# Patient Record
Sex: Female | Born: 1990 | Race: White | Hispanic: No | Marital: Single | Smoking: Never smoker
Health system: Southern US, Community
[De-identification: ages and names within clinical notes are randomized; demographics above are authoritative.]

## PROBLEM LIST (undated history)

## (undated) DIAGNOSIS — R112 Nausea with vomiting, unspecified: Secondary | ICD-10-CM

## (undated) DIAGNOSIS — E0590 Thyrotoxicosis, unspecified without thyrotoxic crisis or storm: Secondary | ICD-10-CM

## (undated) DIAGNOSIS — R319 Hematuria, unspecified: Secondary | ICD-10-CM

## (undated) DIAGNOSIS — R350 Frequency of micturition: Secondary | ICD-10-CM

## (undated) DIAGNOSIS — Z9889 Other specified postprocedural states: Secondary | ICD-10-CM

## (undated) DIAGNOSIS — R3915 Urgency of urination: Secondary | ICD-10-CM

## (undated) DIAGNOSIS — R351 Nocturia: Secondary | ICD-10-CM

## (undated) DIAGNOSIS — Z8709 Personal history of other diseases of the respiratory system: Secondary | ICD-10-CM

## (undated) HISTORY — DX: Hematuria, unspecified: R31.9

## (undated) HISTORY — DX: Thyrotoxicosis, unspecified without thyrotoxic crisis or storm: E05.90

## (undated) HISTORY — PX: WISDOM TOOTH EXTRACTION: SHX21

---
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
## 2009-01-15 ENCOUNTER — Emergency Department (HOSPITAL_COMMUNITY): Admission: EM | Admit: 2009-01-15 | Discharge: 2009-01-15 | Payer: Self-pay | Admitting: Emergency Medicine

## 2009-10-25 ENCOUNTER — Ambulatory Visit (HOSPITAL_COMMUNITY): Admission: RE | Admit: 2009-10-25 | Discharge: 2009-10-25 | Payer: Self-pay | Admitting: Obstetrics and Gynecology

## 2009-11-02 ENCOUNTER — Inpatient Hospital Stay (HOSPITAL_COMMUNITY): Admission: AD | Admit: 2009-11-02 | Discharge: 2009-11-02 | Payer: Self-pay | Admitting: Obstetrics and Gynecology

## 2009-11-05 ENCOUNTER — Emergency Department (HOSPITAL_COMMUNITY): Admission: EM | Admit: 2009-11-05 | Discharge: 2009-11-05 | Payer: Self-pay | Admitting: Family Medicine

## 2010-01-17 ENCOUNTER — Inpatient Hospital Stay (HOSPITAL_COMMUNITY): Admission: AD | Admit: 2010-01-17 | Discharge: 2010-01-17 | Payer: Self-pay | Admitting: Obstetrics and Gynecology

## 2010-07-02 ENCOUNTER — Inpatient Hospital Stay (HOSPITAL_COMMUNITY)
Admission: AD | Admit: 2010-07-02 | Discharge: 2010-07-02 | Disposition: A | Discharge status: Home / Self Care | Payer: Medicaid Other | Source: Ambulatory Visit | Attending: Obstetrics and Gynecology | Admitting: Obstetrics and Gynecology

## 2010-07-02 DIAGNOSIS — O4700 False labor before 37 completed weeks of gestation, unspecified trimester: Secondary | ICD-10-CM | POA: Insufficient documentation

## 2010-07-02 LAB — HEPATIC FUNCTION PANEL
ALT: 10 U/L (ref 0–35)
AST: 22 U/L (ref 0–37)
Albumin: 2.9 g/dL — ABNORMAL LOW (ref 3.5–5.2)
Alkaline Phosphatase: 68 U/L (ref 39–117)
Bilirubin, Direct: 0.1 mg/dL (ref 0.0–0.3)
Indirect Bilirubin: 0.2 mg/dL — ABNORMAL LOW (ref 0.3–0.9)
Total Bilirubin: 0.3 mg/dL (ref 0.3–1.2)
Total Protein: 6 g/dL (ref 6.0–8.3)

## 2010-07-02 LAB — URINE MICROSCOPIC-ADD ON

## 2010-07-02 LAB — WET PREP, GENITAL
Trich, Wet Prep: NONE SEEN
Yeast Wet Prep HPF POC: NONE SEEN

## 2010-07-02 LAB — URINALYSIS, ROUTINE W REFLEX MICROSCOPIC
Bilirubin Urine: NEGATIVE
Ketones, ur: NEGATIVE mg/dL
Leukocytes, UA: NEGATIVE
Nitrite: NEGATIVE
Protein, ur: NEGATIVE mg/dL
Specific Gravity, Urine: 1.02 (ref 1.005–1.030)
Urine Glucose, Fasting: NEGATIVE mg/dL
Urobilinogen, UA: 0.2 mg/dL (ref 0.0–1.0)
pH: 7 (ref 5.0–8.0)

## 2010-07-03 LAB — STREP B DNA PROBE: Strep Group B Ag: POSITIVE

## 2010-07-03 LAB — GC/CHLAMYDIA PROBE AMP, GENITAL
Chlamydia, DNA Probe: NEGATIVE
GC Probe Amp, Genital: NEGATIVE

## 2010-08-02 LAB — URINE CULTURE
Colony Count: NO GROWTH
Culture  Setup Time: 201109010524
Culture: NO GROWTH

## 2010-08-03 LAB — WET PREP, GENITAL
Clue Cells Wet Prep HPF POC: NONE SEEN
Trich, Wet Prep: NONE SEEN

## 2010-08-03 LAB — URINALYSIS, ROUTINE W REFLEX MICROSCOPIC
Bilirubin Urine: NEGATIVE
Glucose, UA: NEGATIVE mg/dL
Ketones, ur: 15 mg/dL — AB
Leukocytes, UA: NEGATIVE
Nitrite: NEGATIVE
Protein, ur: NEGATIVE mg/dL
Specific Gravity, Urine: >1.03 — ABNORMAL HIGH (ref 1.005–1.030)
Urobilinogen, UA: 0.2 mg/dL (ref 0.0–1.0)
pH: 6 (ref 5.0–8.0)

## 2010-08-03 LAB — CBC
Hemoglobin: 11.8 g/dL — ABNORMAL LOW (ref 12.0–15.0)
MCH: 31.1 pg (ref 26.0–34.0)
MCV: 90.7 fL (ref 78.0–100.0)
Platelets: 265 10*3/uL (ref 150–400)
RBC: 3.79 MIL/uL — ABNORMAL LOW (ref 3.87–5.11)
WBC: 8 10*3/uL (ref 4.0–10.5)

## 2010-08-03 LAB — URINE MICROSCOPIC-ADD ON

## 2010-08-03 LAB — HCG, QUANTITATIVE, PREGNANCY: hCG, Beta Chain, Quant, S: 7663 m[IU]/mL — ABNORMAL HIGH (ref <5)

## 2010-08-05 LAB — CBC
HCT: 35.2 % — ABNORMAL LOW (ref 36.0–46.0)
MCHC: 34.1 g/dL (ref 30.0–36.0)
MCV: 89.3 fL (ref 78.0–100.0)
Platelets: 308 10*3/uL (ref 150–400)
RDW: 13.5 % (ref 11.5–15.5)
WBC: 8.5 10*3/uL (ref 4.0–10.5)

## 2010-08-05 LAB — DIFFERENTIAL
Basophils Absolute: 0 10*3/uL (ref 0.0–0.1)
Basophils Relative: 0 % (ref 0–1)
Eosinophils Absolute: 0.1 10*3/uL (ref 0.0–0.7)
Eosinophils Relative: 1 % (ref 0–5)
Lymphs Abs: 3.2 10*3/uL (ref 0.7–4.0)
Neutrophils Relative %: 55 % (ref 43–77)

## 2010-08-23 ENCOUNTER — Inpatient Hospital Stay (HOSPITAL_COMMUNITY)
Admission: AD | Admit: 2010-08-23 | Discharge: 2010-08-23 | Disposition: A | Discharge status: Home / Self Care | Payer: Medicaid Other | Source: Ambulatory Visit | Attending: Obstetrics and Gynecology | Admitting: Obstetrics and Gynecology

## 2010-08-23 DIAGNOSIS — B9789 Other viral agents as the cause of diseases classified elsewhere: Secondary | ICD-10-CM | POA: Insufficient documentation

## 2010-08-23 DIAGNOSIS — O99891 Other specified diseases and conditions complicating pregnancy: Secondary | ICD-10-CM | POA: Insufficient documentation

## 2010-08-23 DIAGNOSIS — O212 Late vomiting of pregnancy: Secondary | ICD-10-CM | POA: Insufficient documentation

## 2010-08-23 LAB — URINALYSIS, ROUTINE W REFLEX MICROSCOPIC
Glucose, UA: NEGATIVE mg/dL
Hgb urine dipstick: NEGATIVE
Ketones, ur: NEGATIVE mg/dL
pH: 6.5 (ref 5.0–8.0)

## 2010-08-23 LAB — COMPREHENSIVE METABOLIC PANEL
ALT: 16 U/L (ref 0–35)
AST: 27 U/L (ref 0–37)
Albumin: 2.8 g/dL — ABNORMAL LOW (ref 3.5–5.2)
CO2: 22 mEq/L (ref 19–32)
Chloride: 108 mEq/L (ref 96–112)
Creatinine, Ser: 0.69 mg/dL (ref 0.4–1.2)
GFR calc Af Amer: >60 mL/min (ref >60)
Sodium: 136 mEq/L (ref 135–145)
Total Bilirubin: 0.5 mg/dL (ref 0.3–1.2)

## 2010-08-23 LAB — DIFFERENTIAL
Basophils Absolute: 0 10*3/uL (ref 0.0–0.1)
Basophils Relative: 0 % (ref 0–1)
Eosinophils Absolute: 0.1 10*3/uL (ref 0.0–0.7)
Monocytes Relative: 6 % (ref 3–12)
Neutro Abs: 8.2 10*3/uL — ABNORMAL HIGH (ref 1.7–7.7)
Neutrophils Relative %: 72 % (ref 43–77)

## 2010-08-23 LAB — CBC
Hemoglobin: 9.7 g/dL — ABNORMAL LOW (ref 12.0–15.0)
MCH: 27.9 pg (ref 26.0–34.0)
Platelets: 277 10*3/uL (ref 150–400)
RBC: 3.48 MIL/uL — ABNORMAL LOW (ref 3.87–5.11)
WBC: 11.5 10*3/uL — ABNORMAL HIGH (ref 4.0–10.5)

## 2010-08-25 LAB — URINALYSIS, ROUTINE W REFLEX MICROSCOPIC
Bilirubin Urine: NEGATIVE
Glucose, UA: NEGATIVE mg/dL
Ketones, ur: NEGATIVE mg/dL
Leukocytes, UA: NEGATIVE
pH: 7.5 (ref 5.0–8.0)

## 2010-08-25 LAB — WET PREP, GENITAL
Trich, Wet Prep: NONE SEEN
Yeast Wet Prep HPF POC: NONE SEEN

## 2010-08-25 LAB — POCT I-STAT, CHEM 8
BUN: 11 mg/dL (ref 6–23)
Chloride: 103 mEq/L (ref 96–112)
HCT: 39 % (ref 36.0–46.0)
Potassium: 4.3 mEq/L (ref 3.5–5.1)

## 2010-08-25 LAB — PROTIME-INR
INR: 1 (ref 0.00–1.49)
Prothrombin Time: 13.5 seconds (ref 11.6–15.2)

## 2010-08-25 LAB — GC/CHLAMYDIA PROBE AMP, GENITAL: GC Probe Amp, Genital: NEGATIVE

## 2010-08-25 LAB — HCG, QUANTITATIVE, PREGNANCY: hCG, Beta Chain, Quant, S: <2 m[IU]/mL (ref <5)

## 2010-08-25 LAB — URINE MICROSCOPIC-ADD ON

## 2010-08-25 LAB — APTT: aPTT: 29 seconds (ref 24–37)

## 2010-08-27 LAB — URINALYSIS, ROUTINE W REFLEX MICROSCOPIC
Hgb urine dipstick: NEGATIVE
Nitrite: NEGATIVE
Specific Gravity, Urine: 1.015 (ref 1.005–1.030)
Urobilinogen, UA: 1 mg/dL (ref 0.0–1.0)

## 2010-08-27 LAB — POCT URINALYSIS DIP (DEVICE)
Glucose, UA: NEGATIVE mg/dL
Hgb urine dipstick: NEGATIVE
Nitrite: NEGATIVE
Urobilinogen, UA: 0.2 mg/dL (ref 0.0–1.0)

## 2010-08-27 LAB — GC/CHLAMYDIA PROBE AMP, GENITAL
GC Probe Amp, Genital: NEGATIVE
GC Probe Amp, Genital: NEGATIVE

## 2010-08-27 LAB — WET PREP, GENITAL: Yeast Wet Prep HPF POC: NONE SEEN

## 2010-08-27 LAB — POCT PREGNANCY, URINE: Preg Test, Ur: NEGATIVE

## 2010-08-28 LAB — CBC
Platelets: 272 10*3/uL (ref 150–400)
RBC: 4.41 MIL/uL (ref 3.87–5.11)
WBC: 6.5 10*3/uL (ref 4.0–10.5)

## 2010-08-28 LAB — URINALYSIS, ROUTINE W REFLEX MICROSCOPIC
Glucose, UA: NEGATIVE mg/dL
Ketones, ur: NEGATIVE mg/dL
pH: 7.5 (ref 5.0–8.0)

## 2010-08-28 LAB — COMPREHENSIVE METABOLIC PANEL
ALT: 18 U/L (ref 0–35)
AST: 20 U/L (ref 0–37)
Albumin: 4.3 g/dL (ref 3.5–5.2)
Chloride: 107 mEq/L (ref 96–112)
Creatinine, Ser: 0.66 mg/dL (ref 0.4–1.2)
GFR calc Af Amer: >60 mL/min (ref >60)
Potassium: 3.8 mEq/L (ref 3.5–5.1)
Sodium: 140 mEq/L (ref 135–145)
Total Bilirubin: 0.7 mg/dL (ref 0.3–1.2)

## 2010-08-28 LAB — DIFFERENTIAL
Basophils Absolute: 0 10*3/uL (ref 0.0–0.1)
Eosinophils Absolute: 0.1 10*3/uL (ref 0.0–0.7)
Eosinophils Relative: 1 % (ref 0–5)
Lymphocytes Relative: 50 % — ABNORMAL HIGH (ref 12–46)
Monocytes Absolute: 0.5 10*3/uL (ref 0.1–1.0)

## 2010-08-28 LAB — WET PREP, GENITAL
Trich, Wet Prep: NONE SEEN
WBC, Wet Prep HPF POC: NONE SEEN
Yeast Wet Prep HPF POC: NONE SEEN

## 2010-08-28 LAB — PREGNANCY, URINE: Preg Test, Ur: NEGATIVE

## 2010-09-03 ENCOUNTER — Inpatient Hospital Stay (HOSPITAL_COMMUNITY): Payer: Medicaid Other

## 2010-09-03 ENCOUNTER — Inpatient Hospital Stay (HOSPITAL_COMMUNITY)
Admission: AD | Admit: 2010-09-03 | Discharge: 2010-09-03 | Disposition: A | Discharge status: Home / Self Care | Payer: Medicaid Other | Source: Ambulatory Visit | Attending: Obstetrics and Gynecology | Admitting: Obstetrics and Gynecology

## 2010-09-03 ENCOUNTER — Inpatient Hospital Stay (HOSPITAL_COMMUNITY)
Admission: AD | Admit: 2010-09-03 | Discharge: 2010-09-04 | Disposition: A | Discharge status: Home / Self Care | Payer: Medicaid Other | Source: Ambulatory Visit | Attending: Obstetrics and Gynecology | Admitting: Obstetrics and Gynecology

## 2010-09-03 DIAGNOSIS — O139 Gestational [pregnancy-induced] hypertension without significant proteinuria, unspecified trimester: Secondary | ICD-10-CM | POA: Insufficient documentation

## 2010-09-03 DIAGNOSIS — O479 False labor, unspecified: Secondary | ICD-10-CM | POA: Insufficient documentation

## 2010-09-03 LAB — URINALYSIS, ROUTINE W REFLEX MICROSCOPIC
Glucose, UA: NEGATIVE mg/dL
pH: 6 (ref 5.0–8.0)

## 2010-09-03 LAB — COMPREHENSIVE METABOLIC PANEL
AST: 28 U/L (ref 0–37)
Albumin: 2.8 g/dL — ABNORMAL LOW (ref 3.5–5.2)
Calcium: 9 mg/dL (ref 8.4–10.5)
Creatinine, Ser: 1.04 mg/dL (ref 0.4–1.2)
GFR calc Af Amer: >60 mL/min (ref >60)
GFR calc non Af Amer: >60 mL/min (ref >60)

## 2010-09-03 LAB — CBC
Hemoglobin: 9.9 g/dL — ABNORMAL LOW (ref 12.0–15.0)
RBC: 3.61 MIL/uL — ABNORMAL LOW (ref 3.87–5.11)

## 2010-09-03 LAB — URINE MICROSCOPIC-ADD ON

## 2010-09-04 ENCOUNTER — Inpatient Hospital Stay (HOSPITAL_COMMUNITY)
Admission: AD | Admit: 2010-09-04 | Discharge: 2010-09-07 | DRG: 774 | Disposition: A | Discharge status: Home / Self Care | Payer: Medicaid Other | Source: Ambulatory Visit | Attending: Obstetrics and Gynecology | Admitting: Obstetrics and Gynecology

## 2010-09-04 DIAGNOSIS — O99284 Endocrine, nutritional and metabolic diseases complicating childbirth: Secondary | ICD-10-CM | POA: Diagnosis present

## 2010-09-04 DIAGNOSIS — O99892 Other specified diseases and conditions complicating childbirth: Secondary | ICD-10-CM | POA: Diagnosis present

## 2010-09-04 DIAGNOSIS — O411090 Infection of amniotic sac and membranes, unspecified, unspecified trimester, not applicable or unspecified: Secondary | ICD-10-CM | POA: Diagnosis present

## 2010-09-04 DIAGNOSIS — E0590 Thyrotoxicosis, unspecified without thyrotoxic crisis or storm: Secondary | ICD-10-CM | POA: Diagnosis present

## 2010-09-04 DIAGNOSIS — Z22330 Carrier of Group B streptococcus: Secondary | ICD-10-CM

## 2010-09-04 DIAGNOSIS — O9902 Anemia complicating childbirth: Secondary | ICD-10-CM | POA: Diagnosis present

## 2010-09-04 DIAGNOSIS — D649 Anemia, unspecified: Secondary | ICD-10-CM | POA: Diagnosis present

## 2010-09-04 DIAGNOSIS — E079 Disorder of thyroid, unspecified: Secondary | ICD-10-CM | POA: Diagnosis present

## 2010-09-04 DIAGNOSIS — IMO0002 Reserved for concepts with insufficient information to code with codable children: Principal | ICD-10-CM | POA: Diagnosis present

## 2010-09-04 LAB — TSH: TSH: 0.837 u[IU]/mL (ref 0.350–4.500)

## 2010-09-04 LAB — URINALYSIS, DIPSTICK ONLY
Glucose, UA: NEGATIVE mg/dL
Ketones, ur: NEGATIVE mg/dL
Leukocytes, UA: NEGATIVE
Specific Gravity, Urine: <1.005 — ABNORMAL LOW (ref 1.005–1.030)
pH: 5.5 (ref 5.0–8.0)

## 2010-09-04 LAB — POCT URINALYSIS DIP (DEVICE)
Glucose, UA: NEGATIVE mg/dL
Ketones, ur: NEGATIVE mg/dL
Specific Gravity, Urine: 1.015 (ref 1.005–1.030)
Urobilinogen, UA: 0.2 mg/dL (ref 0.0–1.0)

## 2010-09-04 LAB — POCT PREGNANCY, URINE: Preg Test, Ur: NEGATIVE

## 2010-09-04 LAB — CBC
HCT: 29.9 % — ABNORMAL LOW (ref 36.0–46.0)
Platelets: 246 10*3/uL (ref 150–400)
RDW: 14.1 % (ref 11.5–15.5)
WBC: 18.9 10*3/uL — ABNORMAL HIGH (ref 4.0–10.5)

## 2010-09-04 LAB — RPR: RPR Ser Ql: NONREACTIVE

## 2010-09-04 LAB — AMNISURE RUPTURE OF MEMBRANE (ROM) NOT AT ARMC: Amnisure ROM: POSITIVE

## 2010-09-04 LAB — DIFFERENTIAL
Basophils Absolute: 0 10*3/uL (ref 0.0–0.1)
Lymphocytes Relative: 7 % — ABNORMAL LOW (ref 12–46)
Lymphs Abs: 1.3 10*3/uL (ref 0.7–4.0)
Neutro Abs: 17.3 10*3/uL — ABNORMAL HIGH (ref 1.7–7.7)
Neutrophils Relative %: 89 % — ABNORMAL HIGH (ref 43–77)

## 2010-09-05 ENCOUNTER — Other Ambulatory Visit: Payer: Self-pay | Admitting: Advanced Practice Midwife

## 2010-09-05 LAB — CREATININE CLEARANCE, URINE, 24 HOUR
Collection Interval-CRCL: 24 hours
Creatinine Clearance: 82 mL/min (ref 75–115)
Creatinine, Urine: 50.3 mg/dL
Urine Total Volume-CRCL: 2450 mL

## 2010-09-06 LAB — COMPREHENSIVE METABOLIC PANEL
ALT: 16 U/L (ref 0–35)
AST: 30 U/L (ref 0–37)
Albumin: 1.9 g/dL — ABNORMAL LOW (ref 3.5–5.2)
Alkaline Phosphatase: 104 U/L (ref 39–117)
CO2: 24 mEq/L (ref 19–32)
Chloride: 108 mEq/L (ref 96–112)
Creatinine, Ser: 1.36 mg/dL — ABNORMAL HIGH (ref 0.4–1.2)
GFR calc Af Amer: 60 mL/min — ABNORMAL LOW (ref >60)
GFR calc non Af Amer: 50 mL/min — ABNORMAL LOW (ref >60)
Potassium: 3.7 mEq/L (ref 3.5–5.1)
Sodium: 139 mEq/L (ref 135–145)
Total Bilirubin: 0.4 mg/dL (ref 0.3–1.2)

## 2010-09-06 LAB — PROTEIN, URINE, 24 HOUR
Protein, 24H Urine: 343 mg/d — ABNORMAL HIGH (ref 50–100)
Protein, Urine: 14 mg/dL

## 2010-09-06 LAB — CBC
Hemoglobin: 7.2 g/dL — ABNORMAL LOW (ref 12.0–15.0)
MCH: 27.8 pg (ref 26.0–34.0)
Platelets: 222 10*3/uL (ref 150–400)
RBC: 2.59 MIL/uL — ABNORMAL LOW (ref 3.87–5.11)
WBC: 17.1 10*3/uL — ABNORMAL HIGH (ref 4.0–10.5)

## 2010-09-06 LAB — MRSA PCR SCREENING: MRSA by PCR: NEGATIVE

## 2010-09-07 LAB — CULTURE, BETA STREP (GROUP B ONLY)

## 2010-09-13 ENCOUNTER — Inpatient Hospital Stay (HOSPITAL_COMMUNITY)
Admission: AD | Admit: 2010-09-13 | Payer: Medicaid Other | Source: Ambulatory Visit | Admitting: Obstetrics and Gynecology
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
## 2010-10-25 HISTORY — PX: INTRAUTERINE DEVICE INSERTION: SHX323

## 2011-01-16 ENCOUNTER — Emergency Department (HOSPITAL_COMMUNITY)
Admission: EM | Admit: 2011-01-16 | Discharge: 2011-01-16 | Disposition: A | Discharge status: Home / Self Care | Payer: Medicaid Other | Attending: Emergency Medicine | Admitting: Emergency Medicine

## 2011-01-16 DIAGNOSIS — Z79899 Other long term (current) drug therapy: Secondary | ICD-10-CM | POA: Insufficient documentation

## 2011-01-16 DIAGNOSIS — IMO0002 Reserved for concepts with insufficient information to code with codable children: Secondary | ICD-10-CM | POA: Insufficient documentation

## 2011-01-16 DIAGNOSIS — S8010XA Contusion of unspecified lower leg, initial encounter: Secondary | ICD-10-CM | POA: Insufficient documentation

## 2011-01-16 DIAGNOSIS — F319 Bipolar disorder, unspecified: Secondary | ICD-10-CM | POA: Insufficient documentation

## 2011-01-16 DIAGNOSIS — S51809A Unspecified open wound of unspecified forearm, initial encounter: Secondary | ICD-10-CM | POA: Insufficient documentation

## 2011-01-16 DIAGNOSIS — X789XXA Intentional self-harm by unspecified sharp object, initial encounter: Secondary | ICD-10-CM | POA: Insufficient documentation

## 2011-02-22 LAB — WET PREP, GENITAL
Clue Cells Wet Prep HPF POC: NONE SEEN
Trich, Wet Prep: NONE SEEN
Trich, Wet Prep: NONE SEEN
WBC, Wet Prep HPF POC: NONE SEEN
Yeast Wet Prep HPF POC: NONE SEEN
Yeast Wet Prep HPF POC: NONE SEEN

## 2011-02-22 LAB — POCT PREGNANCY, URINE
Operator id: 285841
Preg Test, Ur: NEGATIVE
Preg Test, Ur: NEGATIVE
Preg Test, Ur: NEGATIVE

## 2011-02-22 LAB — URINE MICROSCOPIC-ADD ON

## 2011-02-22 LAB — URINALYSIS, ROUTINE W REFLEX MICROSCOPIC
Bilirubin Urine: NEGATIVE
Bilirubin Urine: NEGATIVE
Glucose, UA: NEGATIVE mg/dL
Ketones, ur: NEGATIVE mg/dL
Ketones, ur: NEGATIVE
Nitrite: NEGATIVE
Protein, ur: NEGATIVE mg/dL
Specific Gravity, Urine: 1.007
pH: 6.5 (ref 5.0–8.0)
pH: 7

## 2011-02-22 LAB — RPR: RPR Ser Ql: NONREACTIVE

## 2011-02-28 LAB — POCT RAPID STREP A: Streptococcus, Group A Screen (Direct): NEGATIVE

## 2011-07-18 ENCOUNTER — Encounter (INDEPENDENT_AMBULATORY_CARE_PROVIDER_SITE_OTHER): Payer: Medicaid Other | Admitting: Registered Nurse

## 2011-07-18 DIAGNOSIS — N760 Acute vaginitis: Secondary | ICD-10-CM

## 2011-07-18 DIAGNOSIS — B373 Candidiasis of vulva and vagina: Secondary | ICD-10-CM

## 2011-07-29 ENCOUNTER — Ambulatory Visit: Payer: Medicaid Other | Admitting: Registered Nurse

## 2011-07-31 ENCOUNTER — Ambulatory Visit (INDEPENDENT_AMBULATORY_CARE_PROVIDER_SITE_OTHER): Payer: Medicaid Other | Admitting: Registered Nurse

## 2011-07-31 DIAGNOSIS — Z0000 Encounter for general adult medical examination without abnormal findings: Secondary | ICD-10-CM

## 2012-01-18 ENCOUNTER — Encounter (HOSPITAL_COMMUNITY): Payer: Self-pay | Admitting: General Practice

## 2012-01-18 ENCOUNTER — Emergency Department (HOSPITAL_COMMUNITY)
Admission: EM | Admit: 2012-01-18 | Discharge: 2012-01-18 | Disposition: A | Discharge status: Home / Self Care | Payer: Medicaid Other | Attending: Emergency Medicine | Admitting: Emergency Medicine

## 2012-01-18 DIAGNOSIS — B9689 Other specified bacterial agents as the cause of diseases classified elsewhere: Secondary | ICD-10-CM | POA: Insufficient documentation

## 2012-01-18 DIAGNOSIS — A499 Bacterial infection, unspecified: Secondary | ICD-10-CM | POA: Insufficient documentation

## 2012-01-18 DIAGNOSIS — Z975 Presence of (intrauterine) contraceptive device: Secondary | ICD-10-CM | POA: Insufficient documentation

## 2012-01-18 DIAGNOSIS — R42 Dizziness and giddiness: Secondary | ICD-10-CM | POA: Insufficient documentation

## 2012-01-18 DIAGNOSIS — R102 Pelvic and perineal pain: Secondary | ICD-10-CM

## 2012-01-18 DIAGNOSIS — R350 Frequency of micturition: Secondary | ICD-10-CM | POA: Insufficient documentation

## 2012-01-18 DIAGNOSIS — R110 Nausea: Secondary | ICD-10-CM | POA: Insufficient documentation

## 2012-01-18 DIAGNOSIS — R109 Unspecified abdominal pain: Secondary | ICD-10-CM | POA: Insufficient documentation

## 2012-01-18 DIAGNOSIS — N760 Acute vaginitis: Secondary | ICD-10-CM | POA: Insufficient documentation

## 2012-01-18 LAB — URINALYSIS, ROUTINE W REFLEX MICROSCOPIC
Bilirubin Urine: NEGATIVE
Ketones, ur: NEGATIVE mg/dL
Specific Gravity, Urine: 1.008 (ref 1.005–1.030)
Urobilinogen, UA: 0.2 mg/dL (ref 0.0–1.0)

## 2012-01-18 LAB — WET PREP, GENITAL: Yeast Wet Prep HPF POC: NONE SEEN

## 2012-01-18 LAB — URINE MICROSCOPIC-ADD ON

## 2012-01-18 MED ORDER — METRONIDAZOLE 500 MG PO TABS: 500.0000 mg | ORAL_TABLET | Freq: Two times a day (BID) | ORAL | Status: AC

## 2012-01-18 NOTE — ED Provider Notes (Signed)
History     CSN: [id number]  Arrival date & time 01/18/12  1625   First MD Initiated Contact with Patient 01/18/12 1717      Chief Complaint  Patient presents with  . Abdominal Pain    (Consider location/radiation/quality/duration/timing/severity/associated sxs/prior treatment) HPI Comments: Patient reports she has had lower abdominal cramping, lightheadedness, and nausea x 1 week.  States the cramping is constant.  Does admit to urinary frequency, but states this is chronic.  Denies fevers, vomiting, change in bowel habits including diarrhea, constipation, bloody stool.  Denies abnormal vaginal discharge or bleeding, or dysuria.  LMP was August 10.  Pt has irregular periods but does have them monthly, states this was lighter than usual.  Pt has taken several pregnancy tests at home, has had multiple negative tests and one that looked like a "faint positive."  States these symptoms all feel like her previous pregnancy symptoms.  States she has been treated for chlamydia over the past three weeks with two doses of azithromycin.    Patient is a 21 y.o. female presenting with abdominal pain. The history is provided by the patient.  Abdominal Pain The primary symptoms of the illness include abdominal pain. The primary symptoms of the illness do not include fever, shortness of breath, nausea, vomiting, diarrhea, dysuria, vaginal discharge or vaginal bleeding.  Additional symptoms associated with the illness include frequency. Symptoms associated with the illness do not include constipation or urgency.    History reviewed. No pertinent past medical history.  History reviewed. No pertinent past surgical history.  History reviewed. No pertinent family history.  History  Substance Use Topics  . Smoking status: Not on file  . Smokeless tobacco: Not on file  . Alcohol Use: Yes    OB History    Grav Para Term Preterm Abortions TAB SAB Ect Mult Living                  Review of Systems    Constitutional: Negative for fever.  Respiratory: Negative for cough and shortness of breath.   Cardiovascular: Negative for chest pain.  Gastrointestinal: Positive for abdominal pain. Negative for nausea, vomiting, diarrhea, constipation and blood in stool.  Genitourinary: Positive for frequency. Negative for dysuria, urgency, vaginal bleeding and vaginal discharge.    Allergies  Penicillins  Home Medications  No current outpatient prescriptions on file.  BP 114/65  Pulse 108  Temp 98.2 F (36.8 C) (Oral)  Resp 16  SpO2 99%  Physical Exam  Nursing note and vitals reviewed. Constitutional: She appears well-developed and well-nourished. No distress.  HENT:  Head: Normocephalic and atraumatic.  Neck: Neck supple.  Cardiovascular: Normal rate and regular rhythm.   Pulmonary/Chest: Effort normal and breath sounds normal. No respiratory distress. She has no wheezes. She has no rales.  Abdominal: Soft. She exhibits no distension and no mass. There is tenderness in the suprapubic area. There is no rebound and no guarding.  Genitourinary: Vagina normal. Uterus is tender. Uterus is not enlarged and not fixed. Cervix exhibits no motion tenderness, no discharge and no friability. Right adnexum displays no mass, no tenderness and no fullness. Left adnexum displays no mass, no tenderness and no fullness. No erythema, tenderness or bleeding around the vagina. No signs of injury around the vagina. No vaginal discharge found.       IUD string coming through cervical os  Neurological: She is alert.  Skin: She is not diaphoretic.    ED Course  Procedures (including critical care time)  Labs Reviewed  URINALYSIS, ROUTINE W REFLEX MICROSCOPIC - Abnormal; Notable for the following:    Hgb urine dipstick TRACE (*)     All other components within normal limits  WET PREP, GENITAL - Abnormal; Notable for the following:    Clue Cells Wet Prep HPF POC FEW (*)     WBC, Wet Prep HPF POC FEW (*)      All other components within normal limits  PREGNANCY, URINE  URINE MICROSCOPIC-ADD ON  GC/CHLAMYDIA PROBE AMP, GENITAL  URINE CULTURE   No results found.   1. Suprapubic pain   2. BV (bacterial vaginosis)       MDM  Afebrile, nontoxic pt with one week of crampy lower abdominal pain and symptoms she identifies with pregnancy.  Pregnancy test here is negative - pt does have IUD in place and has had recent period.  Doubt pregnancy.  Pt currently being treated for chlamydia.  No concern for PID on exam.  Pt with some urinary frequency that she states has been more chronic.  UA unremarkable, sent for culture.  No other associated symptoms concerning for any GI pathology.  Discussed all results with patient.  Pt given return precautions.  Pt verbalizes understanding and agrees with plan.           [place], [place] 01/19/12 [phone]

## 2012-01-18 NOTE — ED Notes (Signed)
Pt with generalized abdominal pain, dizziness, nausea. States she took a pregnancy test x 2 that were negative and 1 that looked faintly positive. Not sure of last period(Sometime last month).

## 2012-01-19 NOTE — ED Provider Notes (Signed)
Medical screening examination/treatment/procedure(s) were performed by non-physician practitioner and as supervising physician I was immediately available for consultation/collaboration.   Wendi Maya, MD 01/19/12 1535

## 2012-01-20 LAB — URINE CULTURE: Colony Count: 2000

## 2012-01-21 LAB — GC/CHLAMYDIA PROBE AMP, GENITAL: Chlamydia, DNA Probe: UNDETERMINED

## 2012-02-14 ENCOUNTER — Ambulatory Visit (INDEPENDENT_AMBULATORY_CARE_PROVIDER_SITE_OTHER): Payer: Medicaid Other | Admitting: Obstetrics and Gynecology

## 2012-02-14 ENCOUNTER — Other Ambulatory Visit: Payer: Self-pay

## 2012-02-14 ENCOUNTER — Encounter: Payer: Self-pay | Admitting: Obstetrics and Gynecology

## 2012-02-14 ENCOUNTER — Ambulatory Visit (INDEPENDENT_AMBULATORY_CARE_PROVIDER_SITE_OTHER): Payer: Medicaid Other

## 2012-02-14 VITALS — BP 100/60 | Temp 98.1°F | Ht 59.0 in | Wt 113.0 lb

## 2012-02-14 DIAGNOSIS — A64 Unspecified sexually transmitted disease: Secondary | ICD-10-CM

## 2012-02-14 DIAGNOSIS — R102 Pelvic and perineal pain: Secondary | ICD-10-CM

## 2012-02-14 DIAGNOSIS — B373 Candidiasis of vulva and vagina: Secondary | ICD-10-CM

## 2012-02-14 DIAGNOSIS — N949 Unspecified condition associated with female genital organs and menstrual cycle: Secondary | ICD-10-CM

## 2012-02-14 DIAGNOSIS — Z113 Encounter for screening for infections with a predominantly sexual mode of transmission: Secondary | ICD-10-CM

## 2012-02-14 LAB — POCT URINALYSIS DIPSTICK
Nitrite, UA: NEGATIVE
Protein, UA: NEGATIVE
pH, UA: 7

## 2012-02-14 LAB — POCT URINE PREGNANCY: Preg Test, Ur: NEGATIVE

## 2012-02-14 MED ORDER — HYDROCODONE-ACETAMINOPHEN 5-500 MG PO TABS: 1.0000 | ORAL_TABLET | Freq: Four times a day (QID) | ORAL | Status: DC | PRN

## 2012-02-14 MED ORDER — FLUCONAZOLE 150 MG PO TABS: 150.0000 mg | ORAL_TABLET | Freq: Once | ORAL | Status: AC

## 2012-02-14 MED ORDER — CIPROFLOXACIN HCL 500 MG PO TABS: 500.0000 mg | ORAL_TABLET | Freq: Two times a day (BID) | ORAL | Status: AC

## 2012-02-14 NOTE — Progress Notes (Signed)
Date pain started: 1 month ago Pain scale: 7  Imaging: no Nausea,Vomiting,Fever,chills: nausea,vomitng,fever UTI SX: frequency times 2 weeks  H/O kidney stones: no What makes pain better/worse: nothing Sexually active: yes Pt c/o pain with intercourse

## 2012-02-14 NOTE — Progress Notes (Signed)
21 YO complains of sharp/crampy intermittent pelvic pain x 1 month. Rates pain at 7/10 with pain decreasing to 4/10 with Tylenol.   Worse with intercourse and experiences pain with urination. Denies any change in bowel movements. Past 2 months only spotting x 2 days for period (usually 5 days with tampon change 10 times a day) accompanied by cramping 3/10. Patient has the Mirena IUD and was diagnosed (per patient) with chlamydia a month ago, was treated with a negative TOC at the end of August at [facility].  O: BP 100/60  Temp 98.1 F (36.7 C)  Ht 4\' 11"  (1.499 m)  Wt 113 lb (51.256 kg)  BMI 22.82 kg/m2  LMP 02/03/2012       Abdomen: soft, tender with voluntary guarding in both lower quadrants, no rebound      Pelvic: EGBUS-wnl, vagina-normal, cervix-strings visible with + motion tenderness, uterus-exquisitely tender, adnexae-tender without palpable masses  U/S: uterus-8.66 x 5.73 x 3.99 cm, endometrium 4.77 mm; normal appearing ovaries and IUD within endometrium per 3D rendering  A: Pelvic Pain     Yeast Vaginitis     Recent treatment for Chlamydia (early August 2013)     Mirena IIUD  P:  Consulted Dr. Pennie Rushing as patient is allergic to Doxycycline and PCN       Advised Cipro 500 mg bid x 7 days with follow up in 1 week        Vicodin 5/500 #20 1 po q 6 hours prn breakthrough pain        NSAIDs with food as directed x 5 days        Diflucan 150 mg #1 1 po stat 1 refill        GC/CT-pending (patient's last Chlamydia test 01/18/12 was indeterminant-had bee treated, per patient, at least 3 weeks prior)        RTO-1 week  Zeynep Fantroy, PA-C

## 2012-02-14 NOTE — Patient Instructions (Addendum)
Monilial Vaginitis Vaginitis in a soreness, swelling and redness (inflammation) of the vagina and vulva. Monilial vaginitis is not a sexually transmitted infection. CAUSES  Yeast vaginitis is caused by yeast (candida) that is normally found in your vagina. With a yeast infection, the candida has overgrown in number to a point that upsets the chemical balance. SYMPTOMS   White, thick vaginal discharge.   Swelling, itching, redness and irritation of the vagina and possibly the lips of the vagina (vulva).   Burning or painful urination.   Painful intercourse.  DIAGNOSIS  Things that may contribute to monilial vaginitis are:  Postmenopausal and virginal states.   Pregnancy.   Infections.   Being tired, sick or stressed, especially if you had monilial vaginitis in the past.   Diabetes. Good control will help lower the chance.   Birth control pills.   Tight fitting garments.   Using bubble bath, feminine sprays, douches or deodorant tampons.   Taking certain medications that kill germs (antibiotics).   Sporadic recurrence can occur if you become ill.  TREATMENT  Your caregiver will give you medication.  There are several kinds of anti monilial vaginal creams and suppositories specific for monilial vaginitis. For recurrent yeast infections, use a suppository or cream in the vagina 2 times a week, or as directed.   Anti-monilial or steroid cream for the itching or irritation of the vulva may also be used. Get your caregiver's permission.   Painting the vagina with methylene blue solution may help if the monilial cream does not work.   Eating yogurt may help prevent monilial vaginitis.  HOME CARE INSTRUCTIONS   Finish all medication as prescribed.   Do not have sex until treatment is completed or after your caregiver tells you it is okay.   Take warm sitz baths.   Do not douche.   Do not use tampons, especially scented ones.   Wear cotton underwear.   Avoid tight  pants and panty hose.   Tell your sexual partner that you have a yeast infection. They should go to their caregiver if they have symptoms such as mild rash or itching.   Your sexual partner should be treated as well if your infection is difficult to eliminate.   Practice safer sex. Use condoms.   Some vaginal medications cause latex condoms to fail. Vaginal medications that harm condoms are:   Cleocin cream.   Butoconazole (Femstat).   Terconazole (Terazol) vaginal suppository.   Miconazole (Monistat) (may be purchased over the counter).  SEEK MEDICAL CARE IF:   You have a temperature by mouth above 102 F (38.9 C).   The infection is getting worse after 2 days of treatment.   The infection is not getting better after 3 days of treatment.   You develop blisters in or around your vagina.   You develop vaginal bleeding, and it is not your menstrual period.   You have pain when you urinate.   You develop intestinal problems.   You have pain with sexual intercourse.  Document Released: 02/13/2005 Document Revised: 04/25/2011 Document Reviewed: 10/28/2008 Exit[employer] Patient Information 2012 [employer]. 

## 2012-03-04 ENCOUNTER — Encounter (HOSPITAL_COMMUNITY): Payer: Self-pay | Admitting: *Deleted

## 2012-03-04 ENCOUNTER — Emergency Department (HOSPITAL_COMMUNITY)
Admission: EM | Admit: 2012-03-04 | Discharge: 2012-03-04 | Disposition: A | Discharge status: Home / Self Care | Payer: Medicaid Other | Attending: Emergency Medicine | Admitting: Emergency Medicine

## 2012-03-04 DIAGNOSIS — R51 Headache: Secondary | ICD-10-CM | POA: Insufficient documentation

## 2012-03-04 MED ORDER — DEXAMETHASONE SODIUM PHOSPHATE 10 MG/ML IJ SOLN
10.0000 mg | Freq: Once | INTRAMUSCULAR | Status: AC
  Administered 2012-03-04: 10 mg via INTRAVENOUS
  Filled 2012-03-04: qty 1

## 2012-03-04 MED ORDER — SODIUM CHLORIDE 0.9 % IV BOLUS (SEPSIS)
1000.0000 mL | Freq: Once | INTRAVENOUS | Status: AC
  Administered 2012-03-04: 1000 mL via INTRAVENOUS

## 2012-03-04 MED ORDER — METOCLOPRAMIDE HCL 5 MG/ML IJ SOLN
10.0000 mg | Freq: Once | INTRAMUSCULAR | Status: AC
  Administered 2012-03-04: 10 mg via INTRAVENOUS
  Filled 2012-03-04: qty 2

## 2012-03-04 MED ORDER — DIPHENHYDRAMINE HCL 50 MG/ML IJ SOLN
12.5000 mg | Freq: Once | INTRAMUSCULAR | Status: AC
  Administered 2012-03-04: 12.5 mg via INTRAVENOUS
  Filled 2012-03-04: qty 1

## 2012-03-04 NOTE — ED Notes (Signed)
Pt reports she is ready to go and doesn't want to stay any longer. Pt denies migraine at time.

## 2012-03-04 NOTE — ED Notes (Signed)
Pt with hx of chronic migraines c/o worsening migraine X [redacted] week along with dizziness. Pt reports she fainted 3 X in the past week, she got dizzy then just collapsed, pt reports LOC, denies hitting head. Pt reports ringing in bilateral ears X 2 days.

## 2012-03-04 NOTE — ED Notes (Signed)
headasche for one week  With a history of the same.  No nv or diarrhea.  She had botox injections 3 months ago that did not help

## 2012-03-04 NOTE — ED Provider Notes (Signed)
History     CSN: [id number]  Arrival date & time 03/04/12  1755   First MD Initiated Contact with Patient 03/04/12 2043      Chief Complaint  Patient presents with  . Headache    HPI  21 year old female with history of migraines presents with a weeklong history of headache. Headache feels similar to prior headaches. No nausea, vomiting, chest pain, visual disturbance. She is been using Tylenol around-the-clock. With mild relief in symptoms. Headache not described as worse in her life. Gradual in onset. She denies any trauma. Pain is currently a 6/10. Described as a deep dull ache primarily to the left parietal and periorbital area. She denies any neck stiffness, fevers.  Past Medical History  Diagnosis Date  . Vaginal discharge   . BV (bacterial vaginosis)   . Yeast infection   . Hematuria   . Abnormal Pap smear 07/31/11    LSIL  . Hyperthyroidism     History reviewed. No pertinent past surgical history.  Family History  Problem Relation Age of Onset  . Cancer Mother     cervical  . Migraines Mother   . Hypothyroidism Sister     History  Substance Use Topics  . Smoking status: Never Smoker   . Smokeless tobacco: Not on file  . Alcohol Use: Yes    OB History    Grav Para Term Preterm Abortions TAB SAB Ect Mult Living   2 1        1       Review of Systems  Constitutional: Negative for fever, chills, activity change and appetite change.  HENT: Negative for ear pain, congestion, rhinorrhea and neck pain.   Eyes: Negative for pain.  Respiratory: Negative for cough and shortness of breath.   Cardiovascular: Negative for chest pain and palpitations.  Gastrointestinal: Negative for nausea, vomiting and abdominal pain.  Genitourinary: Negative for dysuria, difficulty urinating and pelvic pain.  Musculoskeletal: Negative for back pain.  Skin: Negative for rash and wound.  Neurological: Positive for headaches. Negative for weakness.  Psychiatric/Behavioral: Negative  for behavioral problems, confusion and agitation.    Allergies  Doxycycline; Penicillins; and Shrimp  Home Medications   Current Outpatient Rx  Name Route Sig Dispense Refill  . ACETAMINOPHEN 500 MG PO TABS Oral Take 1,000 mg by mouth every 6 (six) hours as needed. For pain      BP 120/69  Pulse 94  Temp 99.2 F (37.3 C) (Oral)  Resp 20  SpO2 99%  LMP 02/03/2012  Physical Exam  Constitutional: She is oriented to person, place, and time. She appears well-developed and well-nourished. No distress.  HENT:  Head: Normocephalic and atraumatic.  Nose: Nose normal.  Mouth/Throat: Oropharynx is clear and moist.  Eyes: EOM are normal. Pupils are equal, round, and reactive to light.       No papilledema or hemorrhage on funduscopic exam  Neck: Normal range of motion. Neck supple. No tracheal deviation present.  Cardiovascular: Normal rate, regular rhythm, normal heart sounds and intact distal pulses.   Pulmonary/Chest: Effort normal and breath sounds normal. She has no rales.  Abdominal: Soft. Bowel sounds are normal. She exhibits no distension. There is no tenderness. There is no rebound and no guarding.  Musculoskeletal: Normal range of motion. She exhibits no tenderness.  Neurological: She is alert and oriented to person, place, and time. She has normal strength. No cranial nerve deficit or sensory deficit. She exhibits normal muscle tone. She displays a negative Romberg sign. Coordination  normal. GCS eye subscore is 4. GCS verbal subscore is 5. GCS motor subscore is 6.  Reflex Scores:      Tricep reflexes are 2+ on the right side and 2+ on the left side.      Patellar reflexes are 2+ on the right side and 2+ on the left side.      Achilles reflexes are 2+ on the right side and 2+ on the left side. Skin: Skin is warm and dry. No rash noted.  Psychiatric: She has a normal mood and affect. Her behavior is normal.    ED Course  Procedures   Labs Reviewed - No data to display No  results found.   1. Headache       MDM    21 year old female in no acute distress, afebrile, vital signs stable, non toxic appearing who presents with headache. Multiple prior similar episodes since the age of 47. No neck stiffness, pain is range of motion of the neck doubt meningitis. Headache improved shortly after administration of migraine cocktail (Reglan, Decadron, Benadryl). Patient insisted she be discharged as quickly as possible. As her one 20 year old son was becoming restless in the room. Thorough discussion with the patient on return precautions and plan. She will follow up with her primary care doctor. Consider outpatient neurology consult for long-standing recurrent headaches.         Nadara Mustard, MD 03/04/12 2157

## 2012-03-05 NOTE — ED Provider Notes (Signed)
I saw and evaluated the patient, reviewed the resident's note and I agree with the findings and plan.   .Face to face Exam:  General:  Awake HEENT:  Atraumatic Resp:  Normal effort Abd:  Nondistended Neuro:No focal weakness Lymph: No adenopathy   Nelia Shi, MD 03/05/12 1200

## 2012-12-01 ENCOUNTER — Other Ambulatory Visit: Payer: Self-pay | Admitting: Urology

## 2012-12-23 ENCOUNTER — Encounter (HOSPITAL_BASED_OUTPATIENT_CLINIC_OR_DEPARTMENT_OTHER): Payer: Self-pay | Admitting: *Deleted

## 2012-12-24 ENCOUNTER — Encounter (HOSPITAL_BASED_OUTPATIENT_CLINIC_OR_DEPARTMENT_OTHER): Payer: Self-pay | Admitting: *Deleted

## 2012-12-24 NOTE — Progress Notes (Signed)
NPO AFTER MN. ARRIVES AT 0715. NEEDS HG AND URINE PREG.

## 2012-12-31 ENCOUNTER — Other Ambulatory Visit (HOSPITAL_COMMUNITY)
Admission: RE | Admit: 2012-12-31 | Discharge: 2012-12-31 | Disposition: A | Discharge status: Home / Self Care | Payer: Medicaid Other | Source: Ambulatory Visit | Attending: Family Medicine | Admitting: Family Medicine

## 2012-12-31 ENCOUNTER — Other Ambulatory Visit: Payer: Self-pay | Admitting: Physician Assistant

## 2012-12-31 DIAGNOSIS — Z01419 Encounter for gynecological examination (general) (routine) without abnormal findings: Secondary | ICD-10-CM | POA: Insufficient documentation

## 2013-01-01 ENCOUNTER — Ambulatory Visit (HOSPITAL_BASED_OUTPATIENT_CLINIC_OR_DEPARTMENT_OTHER): Payer: Medicaid Other | Admitting: Anesthesiology

## 2013-01-01 ENCOUNTER — Encounter (HOSPITAL_BASED_OUTPATIENT_CLINIC_OR_DEPARTMENT_OTHER): Payer: Self-pay | Admitting: *Deleted

## 2013-01-01 ENCOUNTER — Ambulatory Visit (HOSPITAL_BASED_OUTPATIENT_CLINIC_OR_DEPARTMENT_OTHER)
Admission: RE | Admit: 2013-01-01 | Discharge: 2013-01-01 | Disposition: A | Discharge status: Home / Self Care | Payer: Medicaid Other | Source: Ambulatory Visit | Attending: Urology | Admitting: Urology

## 2013-01-01 ENCOUNTER — Encounter (HOSPITAL_BASED_OUTPATIENT_CLINIC_OR_DEPARTMENT_OTHER): Payer: Self-pay | Admitting: Anesthesiology

## 2013-01-01 ENCOUNTER — Encounter (HOSPITAL_BASED_OUTPATIENT_CLINIC_OR_DEPARTMENT_OTHER)
Admission: RE | Disposition: A | Discharge status: Home / Self Care | Payer: Self-pay | Source: Ambulatory Visit | Attending: Urology

## 2013-01-01 DIAGNOSIS — Z79899 Other long term (current) drug therapy: Secondary | ICD-10-CM | POA: Insufficient documentation

## 2013-01-01 DIAGNOSIS — G4730 Sleep apnea, unspecified: Secondary | ICD-10-CM | POA: Insufficient documentation

## 2013-01-01 DIAGNOSIS — N3010 Interstitial cystitis (chronic) without hematuria: Secondary | ICD-10-CM | POA: Insufficient documentation

## 2013-01-01 DIAGNOSIS — E039 Hypothyroidism, unspecified: Secondary | ICD-10-CM | POA: Insufficient documentation

## 2013-01-01 HISTORY — DX: Frequency of micturition: R35.0

## 2013-01-01 HISTORY — DX: Personal history of other diseases of the respiratory system: Z87.09

## 2013-01-01 HISTORY — DX: Other specified postprocedural states: R11.2

## 2013-01-01 HISTORY — DX: Urgency of urination: R39.15

## 2013-01-01 HISTORY — DX: Other specified postprocedural states: Z98.890

## 2013-01-01 HISTORY — DX: Nocturia: R35.1

## 2013-01-01 HISTORY — PX: CYSTOSCOPY: SHX5120

## 2013-01-01 LAB — POCT PREGNANCY, URINE: Preg Test, Ur: NEGATIVE

## 2013-01-01 LAB — POCT HEMOGLOBIN-HEMACUE: Hemoglobin: 12.5 g/dL (ref 12.0–15.0)

## 2013-01-01 SURGERY — CYSTOSCOPY
Anesthesia: General | Site: Bladder | Wound class: Clean Contaminated

## 2013-01-01 MED ORDER — FENTANYL CITRATE 0.05 MG/ML IJ SOLN
25.0000 ug | INTRAMUSCULAR | Status: DC | PRN
  Filled 2013-01-01: qty 1

## 2013-01-01 MED ORDER — CIPROFLOXACIN IN D5W 400 MG/200ML IV SOLN
INTRAVENOUS | Status: DC | PRN
  Administered 2013-01-01: 400 mg via INTRAVENOUS

## 2013-01-01 MED ORDER — LACTATED RINGERS IV SOLN
INTRAVENOUS | Status: DC
  Administered 2013-01-01 (×2): via INTRAVENOUS
  Filled 2013-01-01: qty 1000

## 2013-01-01 MED ORDER — ONDANSETRON HCL 4 MG/2ML IJ SOLN
INTRAMUSCULAR | Status: DC | PRN
  Administered 2013-01-01: 4 mg via INTRAVENOUS

## 2013-01-01 MED ORDER — LIDOCAINE HCL (CARDIAC) 20 MG/ML IV SOLN
INTRAVENOUS | Status: DC | PRN
  Administered 2013-01-01 (×2): 50 mg via INTRAVENOUS

## 2013-01-01 MED ORDER — HYDROCODONE-ACETAMINOPHEN 5-325 MG PO TABS
1.0000 | ORAL_TABLET | Freq: Once | ORAL | Status: AC
  Administered 2013-01-01: 1 via ORAL
  Filled 2013-01-01: qty 1

## 2013-01-01 MED ORDER — SCOPOLAMINE 1 MG/3DAYS TD PT72
MEDICATED_PATCH | TRANSDERMAL | Status: DC | PRN
  Administered 2013-01-01: 1.5 mg via TRANSDERMAL

## 2013-01-01 MED ORDER — DEXAMETHASONE SODIUM PHOSPHATE 4 MG/ML IJ SOLN
INTRAMUSCULAR | Status: DC | PRN
  Administered 2013-01-01: 8 mg via INTRAVENOUS

## 2013-01-01 MED ORDER — STERILE WATER FOR IRRIGATION IR SOLN
Status: DC | PRN
  Administered 2013-01-01: 3000 mL via INTRAVESICAL

## 2013-01-01 MED ORDER — MIDAZOLAM HCL 5 MG/5ML IJ SOLN
INTRAMUSCULAR | Status: DC | PRN
  Administered 2013-01-01 (×2): 1 mg via INTRAVENOUS

## 2013-01-01 MED ORDER — PROPOFOL 10 MG/ML IV BOLUS
INTRAVENOUS | Status: DC | PRN
  Administered 2013-01-01: 200 mg via INTRAVENOUS

## 2013-01-01 MED ORDER — KETOROLAC TROMETHAMINE 30 MG/ML IJ SOLN
15.0000 mg | Freq: Once | INTRAMUSCULAR | Status: DC | PRN
  Filled 2013-01-01: qty 1

## 2013-01-01 MED ORDER — FENTANYL CITRATE 0.05 MG/ML IJ SOLN
INTRAMUSCULAR | Status: DC | PRN
  Administered 2013-01-01 (×3): 25 ug via INTRAVENOUS
  Administered 2013-01-01: 50 ug via INTRAVENOUS
  Administered 2013-01-01 (×3): 25 ug via INTRAVENOUS

## 2013-01-01 MED ORDER — PHENAZOPYRIDINE HCL 200 MG PO TABS
ORAL | Status: DC | PRN
  Administered 2013-01-01: 10:00:00 via INTRAVESICAL

## 2013-01-01 MED ORDER — CEFAZOLIN SODIUM 1-5 GM-% IV SOLN
1.0000 g | INTRAVENOUS | Status: DC
  Filled 2013-01-01: qty 50

## 2013-01-01 MED ORDER — PROMETHAZINE HCL 25 MG/ML IJ SOLN
6.2500 mg | INTRAMUSCULAR | Status: DC | PRN
  Administered 2013-01-01: 6.25 mg via INTRAVENOUS
  Filled 2013-01-01: qty 1

## 2013-01-01 MED ORDER — KETOROLAC TROMETHAMINE 30 MG/ML IJ SOLN
INTRAMUSCULAR | Status: DC | PRN
  Administered 2013-01-01: 30 mg via INTRAVENOUS

## 2013-01-01 SURGICAL SUPPLY — 18 items
BAG DRAIN URO-CYSTO SKYTR STRL (DRAIN) ×2 IMPLANT
BAG DRN UROCATH (DRAIN) ×1
CANISTER SUCT LVC 12 LTR MEDI- (MISCELLANEOUS) IMPLANT
CLOTH BEACON ORANGE TIMEOUT ST (SAFETY) ×2 IMPLANT
DRAPE CAMERA CLOSED 9X96 (DRAPES) IMPLANT
ELECT REM PT RETURN 9FT ADLT (ELECTROSURGICAL) ×2
ELECTRODE REM PT RTRN 9FT ADLT (ELECTROSURGICAL) ×1 IMPLANT
GLOVE BIO SURGEON STRL SZ 6.5 (GLOVE) ×2 IMPLANT
GLOVE BIO SURGEON STRL SZ7 (GLOVE) ×2 IMPLANT
GLOVE INDICATOR 6.5 STRL GRN (GLOVE) ×2 IMPLANT
GOWN STRL NON-REIN LRG LVL3 (GOWN DISPOSABLE) ×4 IMPLANT
NDL SAFETY ECLIPSE 18X1.5 (NEEDLE) ×1 IMPLANT
NEEDLE HYPO 18GX1.5 SHARP (NEEDLE) ×2
NEEDLE HYPO 22GX1.5 SAFETY (NEEDLE) IMPLANT
NS IRRIG 500ML POUR BTL (IV SOLUTION) IMPLANT
PACK CYSTOSCOPY (CUSTOM PROCEDURE TRAY) ×2 IMPLANT
SYR 20CC LL (SYRINGE) ×2 IMPLANT
WATER STERILE IRR 3000ML UROMA (IV SOLUTION) ×2 IMPLANT

## 2013-01-01 NOTE — Anesthesia Preprocedure Evaluation (Signed)
Anesthesia Evaluation  Patient identified by MRN, date of birth, ID band Patient awake    Reviewed: Allergy & Precautions, H&P , NPO status , Patient's Chart, lab work & pertinent test results  History of Anesthesia Complications (+) PONV  Airway Mallampati: II TM Distance: >3 FB Neck ROM: Full    Dental no notable dental hx.    Pulmonary neg pulmonary ROS,  breath sounds clear to auscultation  Pulmonary exam normal       Cardiovascular negative cardio ROS  Rhythm:Regular Rate:Normal     Neuro/Psych negative neurological ROS  negative psych ROS   GI/Hepatic negative GI ROS, Neg liver ROS,   Endo/Other  Hyperthyroidism Hyperthyroidism   DX 2010--  NO TX PER PT --  JUST MONITORED   Renal/GU negative Renal ROS  negative genitourinary   Musculoskeletal negative musculoskeletal ROS (+)   Abdominal   Peds negative pediatric ROS (+)  Hematology negative hematology ROS (+)   Anesthesia Other Findings   Reproductive/Obstetrics negative OB ROS                           Anesthesia Physical Anesthesia Plan  ASA: I  Anesthesia Plan: General   Post-op Pain Management:    Induction: Intravenous  Airway Management Planned: LMA  Additional Equipment:   Intra-op Plan:   Post-operative Plan:   Informed Consent: I have reviewed the patients History and Physical, chart, labs and discussed the procedure including the risks, benefits and alternatives for the proposed anesthesia with the patient or authorized representative who has indicated his/her understanding and acceptance.   Dental advisory given  Plan Discussed with: CRNA and Surgeon  Anesthesia Plan Comments:         Anesthesia Quick Evaluation

## 2013-01-01 NOTE — Anesthesia Postprocedure Evaluation (Signed)
  Anesthesia Post-op Note  Patient: Lindsay Graham  Procedure(s) Performed: Procedure(s) (LRB): CYSTOSCOPY with instillation of marcaine and pyridium (N/A)  Patient Location: PACU  Anesthesia Type: General  Level of Consciousness: awake and alert   Airway and Oxygen Therapy: Patient Spontanous Breathing  Post-op Pain: mild  Post-op Assessment: Post-op Vital signs reviewed, Patient's Cardiovascular Status Stable, Respiratory Function Stable, Patent Airway and No signs of Nausea or vomiting  Last Vitals:  Filed Vitals:   01/01/13 0951  BP: 97/67  Pulse: 92  Temp: 36.5 C  Resp: 14    Post-op Vital Signs: stable   Complications: No apparent anesthesia complications

## 2013-01-01 NOTE — Op Note (Signed)
Lindsay Graham is a 22 y.o.   01/01/2013  General  Preop diagnosis: Urethral syndrome, overactive bladder  Postop diagnosis: Interstitial cystitis  Procedure done: Cystoscopy, HOD, meatal dilation, instillation of Marcaine and Pyridium  Surgeon: Wendie Simmer. Lindsay Graham  Anesthesia: General  Indication:  Patient is a 22 years old female who has been complaining of frequency, urgency. She had not had any pelvic pain. She failed Toviaz, Vesicare, Myrbetriq. Urodynamic studies showed hypersensitive bladder, good detrusor contraction. She is now scheduled for cystoscopy.  Procedure:The patient was identified by her wrist band and proper timeout was taken.  Under general anesthesia she was prepped and draped and placed in the dorsolithotomy position. A panendoscope was inserted in the bladder. There is no stone or tumor in the bladder. The bladder mucosa initially appeared normal. However after dilation there was several areas of submucosal hemorrhage. The ureteral orifices are in normal position and shape. The bladder was then distended under a pressure of 60 cm of water for about 5 minutes. The bladder capacity is about 600 cc.  The cystoscope was removed and. The meatus was dilated up to #30 [place] with female sounds. Then 400 mg of Pyridium in 30 cc of Marcaine were instilled in the bladder.  Patient tolerated the procedure well and left the OR in satisfactory condition to postanesthesia care unit.

## 2013-01-01 NOTE — H&P (Signed)
  History of Present Illness     Lindsay Graham returns for follow-up.  She is on Myrbetriq 50 mgm and Vesicare. Her symptoms are still the same.  She also failed Toviaz.  She does not have any pelvic pain.  Urodynamics revealed hypersensitive bladder.  Bladder pressure was 57-61cm /H2O.  She had good detrusor contraction.  There was increased EMG activity during first void.  But pelvic floor muscles were quiet during second void.  She needs cystoscopy under anesthesia for further evaluation.   Past Medical History Problems  1. History of  Adult Sleep Apnea 780.57 2. History of  Anxiety (Symptom) 300.00 3. History of  Depression 311 4. History of  Hypothyroidism 244.9  Surgical History Problems  1. History of  Dilation And Curettage  Current Meds 1. Diazepam 2 MG Oral Tablet; Therapy: (Recorded:02Jun2014) to 2. Lexapro 10 MG Oral Tablet; Therapy: (Recorded:02Jun2014) to 3. Tylenol TABS; Therapy: (Recorded:07Feb2014) to  Allergies Medication  1. Azithromycin TABS 2. Penicillins  Family History Problems  1. Family history of  Family Health Status Number Of Children 1 son 2. Family history of  Family Health Status Of Father - Alive 3. Family history of  Family Health Status Of Mother - Alive  Social History Problems  1. Caffeine Use 2 2. Never A Smoker Denied  3. History of  Alcohol Use  Review of Systems Genitourinary, constitutional, skin, eye, otolaryngeal, hematologic/lymphatic, cardiovascular, pulmonary, endocrine, musculoskeletal, gastrointestinal, neurological and psychiatric system(s) were reviewed and pertinent findings if present are noted.  Genitourinary: urinary frequency, urinary urgency and nocturia.    Physical Exam Constitutional: Well nourished and well developed . No acute distress.  ENT:. The ears and nose are normal in appearance.  Neck: The appearance of the neck is normal and no neck mass is present.  Pulmonary: No respiratory distress and normal  respiratory rhythm and effort.  Cardiovascular: Heart rate and rhythm are normal . No peripheral edema.  Abdomen: The abdomen is soft and nontender. No masses are palpated. No CVA tenderness. No hernias are palpable. No hepatosplenomegaly noted.  Genitourinary:  Chaperone Present: .  Examination of the external genitalia shows normal female external genitalia and no lesions. The urethra is normal in appearance and not tender. There is no urethral mass. Vaginal exam demonstrates no abnormalities. The adnexa are palpably normal. The bladder is non tender and not distended. The anus is normal on inspection. The perineum is normal on inspection.  Lymphatics: The femoral and inguinal nodes are not enlarged or tender.  Skin: Normal skin turgor, no visible rash and no visible skin lesions.  Neuro/Psych:. Mood and affect are appropriate.    Results/Data Urine [Data Includes: Last 1 Day]   03Jul2014 COLOR YELLOW  APPEARANCE CLOUDY  SPECIFIC GRAVITY 1.025  pH 6.0  GLUCOSE NEG mg/dL BILIRUBIN NEG  KETONE NEG mg/dL BLOOD TRACE  PROTEIN NEG mg/dL UROBILINOGEN 0.2 mg/dL NITRITE NEG  LEUKOCYTE ESTERASE NEG  SQUAMOUS EPITHELIAL/HPF MODERATE  WBC 0-2 WBC/hpf RBC 3-6 RBC/hpf BACTERIA MODERATE  CRYSTALS NONE SEEN  CASTS NONE SEEN   Assessment Assessed  1. Hyperactivity Of The Bladder 596.51 2. Urinary Frequency 788.41  Plan Health Maintenance (V70.0)  1. UA With REFLEX  Done: 03Jul2014 01:51PM   Cystoscopy under general anesthesia.  The procedure, risks, benefits were explained to the patient.  The risks include but are not limited to hemorrhage, infection, bladder injury.  She understands and wishes to procee

## 2013-01-01 NOTE — Anesthesia Procedure Notes (Signed)
Procedure Name: LMA Insertion Date/Time: 01/01/2013 9:04 AM Performed by: Jessica Priest Pre-anesthesia Checklist: Patient identified, Emergency Drugs available, Suction available and Patient being monitored Patient Re-evaluated:Patient Re-evaluated prior to inductionOxygen Delivery Method: Circle System Utilized Preoxygenation: Pre-oxygenation with 100% oxygen Intubation Type: IV induction Ventilation: Mask ventilation without difficulty LMA: LMA inserted LMA Size: 3.0 Number of attempts: 1 Airway Equipment and Method: bite block Placement Confirmation: positive ETCO2 Tube secured with: Tape Dental Injury: Teeth and Oropharynx as per pre-operative assessment

## 2013-01-01 NOTE — Transfer of Care (Signed)
Immediate Anesthesia Transfer of Care Note  Patient: Lindsay Graham  Procedure(s) Performed: Procedure(s) (LRB): CYSTOSCOPY with instillation of marcaine and pyridium (N/A)  Patient Location: PACU  Anesthesia Type: General  Level of Consciousness: awake, sedated, patient cooperative and responds to stimulation  Airway & Oxygen Therapy: Patient Spontanous Breathing and Patient connected to face mask oxygen  Post-op Assessment: Report given to PACU RN, Post -op Vital signs reviewed and stable and Patient moving all extremities  Post vital signs: Reviewed and stable  Complications: No apparent anesthesia complications

## 2013-01-04 ENCOUNTER — Encounter (HOSPITAL_BASED_OUTPATIENT_CLINIC_OR_DEPARTMENT_OTHER): Payer: Self-pay | Admitting: Urology

## 2013-09-03 ENCOUNTER — Other Ambulatory Visit (HOSPITAL_COMMUNITY): Payer: Self-pay | Admitting: Family Medicine

## 2013-09-03 ENCOUNTER — Encounter (HOSPITAL_COMMUNITY): Payer: Self-pay

## 2013-09-03 ENCOUNTER — Ambulatory Visit (HOSPITAL_COMMUNITY)
Admission: RE | Admit: 2013-09-03 | Discharge: 2013-09-03 | Disposition: A | Discharge status: Home / Self Care | Payer: Medicaid Other | Source: Ambulatory Visit | Attending: Family Medicine | Admitting: Family Medicine

## 2013-09-03 DIAGNOSIS — R102 Pelvic and perineal pain: Secondary | ICD-10-CM

## 2013-09-03 DIAGNOSIS — N949 Unspecified condition associated with female genital organs and menstrual cycle: Secondary | ICD-10-CM | POA: Insufficient documentation

## 2013-09-03 MED ORDER — IOHEXOL 300 MG/ML  SOLN
80.0000 mL | Freq: Once | INTRAMUSCULAR | Status: AC | PRN
  Administered 2013-09-03: 80 mL via INTRAVENOUS

## 2013-12-30 ENCOUNTER — Encounter (HOSPITAL_COMMUNITY): Payer: Self-pay | Admitting: Emergency Medicine

## 2013-12-30 ENCOUNTER — Emergency Department (HOSPITAL_COMMUNITY)
Admission: EM | Admit: 2013-12-30 | Discharge: 2013-12-31 | Disposition: A | Discharge status: Home / Self Care | Payer: Medicaid Other | Attending: Emergency Medicine | Admitting: Emergency Medicine

## 2013-12-30 DIAGNOSIS — R109 Unspecified abdominal pain: Secondary | ICD-10-CM | POA: Diagnosis present

## 2013-12-30 DIAGNOSIS — N739 Female pelvic inflammatory disease, unspecified: Secondary | ICD-10-CM | POA: Diagnosis not present

## 2013-12-30 DIAGNOSIS — G8929 Other chronic pain: Secondary | ICD-10-CM | POA: Insufficient documentation

## 2013-12-30 DIAGNOSIS — Z8639 Personal history of other endocrine, nutritional and metabolic disease: Secondary | ICD-10-CM | POA: Diagnosis not present

## 2013-12-30 DIAGNOSIS — Z79899 Other long term (current) drug therapy: Secondary | ICD-10-CM | POA: Insufficient documentation

## 2013-12-30 DIAGNOSIS — Z880 Allergy status to penicillin: Secondary | ICD-10-CM | POA: Diagnosis not present

## 2013-12-30 DIAGNOSIS — Z792 Long term (current) use of antibiotics: Secondary | ICD-10-CM | POA: Diagnosis not present

## 2013-12-30 DIAGNOSIS — IMO0002 Reserved for concepts with insufficient information to code with codable children: Secondary | ICD-10-CM | POA: Diagnosis not present

## 2013-12-30 DIAGNOSIS — R1084 Generalized abdominal pain: Secondary | ICD-10-CM | POA: Diagnosis not present

## 2013-12-30 DIAGNOSIS — Z8709 Personal history of other diseases of the respiratory system: Secondary | ICD-10-CM | POA: Insufficient documentation

## 2013-12-30 DIAGNOSIS — R110 Nausea: Secondary | ICD-10-CM | POA: Diagnosis not present

## 2013-12-30 DIAGNOSIS — Z87448 Personal history of other diseases of urinary system: Secondary | ICD-10-CM | POA: Diagnosis not present

## 2013-12-30 DIAGNOSIS — Z862 Personal history of diseases of the blood and blood-forming organs and certain disorders involving the immune mechanism: Secondary | ICD-10-CM | POA: Insufficient documentation

## 2013-12-30 DIAGNOSIS — N949 Unspecified condition associated with female genital organs and menstrual cycle: Secondary | ICD-10-CM | POA: Diagnosis not present

## 2013-12-30 DIAGNOSIS — Z62810 Personal history of physical and sexual abuse in childhood: Secondary | ICD-10-CM

## 2013-12-30 DIAGNOSIS — R102 Pelvic and perineal pain: Secondary | ICD-10-CM

## 2013-12-30 LAB — CBC WITH DIFFERENTIAL/PLATELET
BASOS ABS: 0 10*3/uL (ref 0.0–0.1)
BASOS PCT: 1 % (ref 0–1)
EOS ABS: 0.1 10*3/uL (ref 0.0–0.7)
Eosinophils Relative: 1 % (ref 0–5)
HCT: 37.6 % (ref 36.0–46.0)
HEMOGLOBIN: 12.8 g/dL (ref 12.0–15.0)
Lymphocytes Relative: 37 % (ref 12–46)
Lymphs Abs: 2.7 10*3/uL (ref 0.7–4.0)
MCH: 30.2 pg (ref 26.0–34.0)
MCHC: 34 g/dL (ref 30.0–36.0)
MCV: 88.7 fL (ref 78.0–100.0)
MONOS PCT: 4 % (ref 3–12)
Monocytes Absolute: 0.3 10*3/uL (ref 0.1–1.0)
NEUTROS ABS: 4.2 10*3/uL (ref 1.7–7.7)
NEUTROS PCT: 57 % (ref 43–77)
PLATELETS: 359 10*3/uL (ref 150–400)
RBC: 4.24 MIL/uL (ref 3.87–5.11)
RDW: 12.8 % (ref 11.5–15.5)
WBC: 7.4 10*3/uL (ref 4.0–10.5)

## 2013-12-30 LAB — WET PREP, GENITAL
TRICH WET PREP: NONE SEEN
YEAST WET PREP: NONE SEEN

## 2013-12-30 LAB — URINALYSIS, ROUTINE W REFLEX MICROSCOPIC
Bilirubin Urine: NEGATIVE
GLUCOSE, UA: NEGATIVE mg/dL
Ketones, ur: NEGATIVE mg/dL
NITRITE: NEGATIVE
PH: 7.5 (ref 5.0–8.0)
Protein, ur: NEGATIVE mg/dL
SPECIFIC GRAVITY, URINE: 1.011 (ref 1.005–1.030)
Urobilinogen, UA: 0.2 mg/dL (ref 0.0–1.0)

## 2013-12-30 LAB — COMPREHENSIVE METABOLIC PANEL
ALBUMIN: 4.7 g/dL (ref 3.5–5.2)
ALK PHOS: 46 U/L (ref 39–117)
ALT: 16 U/L (ref 0–35)
ANION GAP: 15 (ref 5–15)
AST: 35 U/L (ref 0–37)
BUN: 10 mg/dL (ref 6–23)
CO2: 22 mEq/L (ref 19–32)
CREATININE: 0.77 mg/dL (ref 0.50–1.10)
Calcium: 9.5 mg/dL (ref 8.4–10.5)
Chloride: 102 mEq/L (ref 96–112)
GFR calc Af Amer: >90 mL/min (ref >90)
GFR calc non Af Amer: >90 mL/min (ref >90)
Glucose, Bld: 96 mg/dL (ref 70–99)
POTASSIUM: 4.1 meq/L (ref 3.7–5.3)
Sodium: 139 mEq/L (ref 137–147)
TOTAL PROTEIN: 7.6 g/dL (ref 6.0–8.3)
Total Bilirubin: 0.7 mg/dL (ref 0.3–1.2)

## 2013-12-30 LAB — URINE MICROSCOPIC-ADD ON

## 2013-12-30 LAB — POC OCCULT BLOOD, ED: Fecal Occult Bld: NEGATIVE

## 2013-12-30 MED ORDER — SODIUM CHLORIDE 0.9 % IV BOLUS (SEPSIS)
1000.0000 mL | Freq: Once | INTRAVENOUS | Status: AC
  Administered 2013-12-30: 1000 mL via INTRAVENOUS

## 2013-12-30 MED ORDER — IBUPROFEN 400 MG PO TABS: 400.0000 mg | ORAL_TABLET | Freq: Four times a day (QID) | ORAL | Status: AC | PRN

## 2013-12-30 MED ORDER — HYDROMORPHONE HCL PF 1 MG/ML IJ SOLN
0.5000 mg | Freq: Once | INTRAMUSCULAR | Status: AC
  Administered 2013-12-30: 0.5 mg via INTRAVENOUS
  Filled 2013-12-30: qty 1

## 2013-12-30 MED ORDER — DOXYCYCLINE HYCLATE 100 MG PO CAPS: 100.0000 mg | ORAL_CAPSULE | Freq: Two times a day (BID) | ORAL | Status: AC

## 2013-12-30 MED ORDER — PROMETHAZINE HCL 25 MG/ML IJ SOLN
12.5000 mg | Freq: Once | INTRAMUSCULAR | Status: AC
  Administered 2013-12-30: 12.5 mg via INTRAVENOUS
  Filled 2013-12-30: qty 1

## 2013-12-30 MED ORDER — ONDANSETRON HCL 4 MG PO TABS: 4.0000 mg | ORAL_TABLET | Freq: Four times a day (QID) | ORAL | Status: DC

## 2013-12-30 MED ORDER — HYDROCODONE-ACETAMINOPHEN 5-325 MG PO TABS: 1.0000 | ORAL_TABLET | ORAL | Status: DC | PRN

## 2013-12-30 MED ORDER — DEXTROSE 5 % IV SOLN
1000.0000 mg | Freq: Once | INTRAVENOUS | Status: AC
  Administered 2013-12-31: 1000 mg via INTRAVENOUS
  Filled 2013-12-30: qty 10

## 2013-12-30 MED ORDER — DOXYCYCLINE HYCLATE 100 MG PO TABS
100.0000 mg | ORAL_TABLET | Freq: Once | ORAL | Status: AC
  Administered 2013-12-30: 100 mg via ORAL
  Filled 2013-12-30: qty 1

## 2013-12-30 MED ORDER — HYDROMORPHONE HCL PF 1 MG/ML IJ SOLN
1.0000 mg | Freq: Once | INTRAMUSCULAR | Status: AC
  Administered 2013-12-30: 1 mg via INTRAVENOUS
  Filled 2013-12-30: qty 1

## 2013-12-30 NOTE — ED Notes (Signed)
Patient states woke up this morning and she started having abdominal pain.  Patient complains of lower abdominal pain.   Patient states she had dark stool this morning, but didn't really see blood.  Patient denies urinary symptoms.  Patient has had some nausea, but no vomiting.

## 2013-12-30 NOTE — ED Notes (Signed)
Pt placed into gown and on monitor upon arrival to room. Pt monitored by blood pressure and pulse ox.  

## 2013-12-30 NOTE — ED Notes (Signed)
Pt remains monitored by blood pressure and pulse ox.  Pt family remains at bedside.  

## 2013-12-30 NOTE — ED Notes (Signed)
Pt remains monitored by blood pressure and pulse ox. Pts family remains at bedside.  

## 2013-12-30 NOTE — ED Notes (Signed)
States she woke up 5am with abd pain, states she thought she injury herself playing football yest. C/o dark stools today states she took 800 ibuoprifen without relief. /co nausea no vomiting.

## 2013-12-30 NOTE — Discharge Instructions (Signed)
Abdominal Pain, Women °Abdominal (stomach, pelvic, or belly) pain can be caused by many things. It is important to tell your doctor: °· The location of the pain. °· Does it come and go or is it present all the time? °· Are there things that start the pain (eating certain foods, exercise)? °· Are there other symptoms associated with the pain (fever, nausea, vomiting, diarrhea)? °All of this is helpful to know when trying to find the cause of the pain. °CAUSES  °· Stomach: virus or bacteria infection, or ulcer. °· Intestine: appendicitis (inflamed appendix), regional ileitis (Crohn's disease), ulcerative colitis (inflamed colon), irritable bowel syndrome, diverticulitis (inflamed diverticulum of the colon), or cancer of the stomach or intestine. °· Gallbladder disease or stones in the gallbladder. °· Kidney disease, kidney stones, or infection. °· Pancreas infection or cancer. °· Fibromyalgia (pain disorder). °· Diseases of the female organs: °¨ Uterus: fibroid (non-cancerous) tumors or infection. °¨ Fallopian tubes: infection or tubal pregnancy. °¨ Ovary: cysts or tumors. °¨ Pelvic adhesions (scar tissue). °¨ Endometriosis (uterus lining tissue growing in the pelvis and on the pelvic organs). °¨ Pelvic congestion syndrome (female organs filling up with blood just before the menstrual period). °¨ Pain with the menstrual period. °¨ Pain with ovulation (producing an egg). °¨ Pain with an IUD (intrauterine device, birth control) in the uterus. °¨ Cancer of the female organs. °· Functional pain (pain not caused by a disease, may improve without treatment). °· Psychological pain. °· Depression. °DIAGNOSIS  °Your doctor will decide the seriousness of your pain by doing an examination. °· Blood tests. °· X-rays. °· Ultrasound. °· CT scan (computed tomography, special type of X-ray). °· MRI (magnetic resonance imaging). °· Cultures, for infection. °· Barium enema (dye inserted in the large intestine, to better view it with  X-rays). °· Colonoscopy (looking in intestine with a lighted tube). °· Laparoscopy (minor surgery, looking in abdomen with a lighted tube). °· Major abdominal exploratory surgery (looking in abdomen with a large incision). °TREATMENT  °The treatment will depend on the cause of the pain.  °· Many cases can be observed and treated at home. °· Over-the-counter medicines recommended by your caregiver. °· Prescription medicine. °· Antibiotics, for infection. °· Birth control pills, for painful periods or for ovulation pain. °· Hormone treatment, for endometriosis. °· Nerve blocking injections. °· Physical therapy. °· Antidepressants. °· Counseling with a psychologist or psychiatrist. °· Minor or major surgery. °HOME CARE INSTRUCTIONS  °· Do not take laxatives, unless directed by your caregiver. °· Take over-the-counter pain medicine only if ordered by your caregiver. Do not take aspirin because it can cause an upset stomach or bleeding. °· Try a clear liquid diet (broth or water) as ordered by your caregiver. Slowly move to a bland diet, as tolerated, if the pain is related to the stomach or intestine. °· Have a thermometer and take your temperature several times a day, and record it. °· Bed rest and sleep, if it helps the pain. °· Avoid sexual intercourse, if it causes pain. °· Avoid stressful situations. °· Keep your follow-up appointments and tests, as your caregiver orders. °· If the pain does not go away with medicine or surgery, you may try: °¨ Acupuncture. °¨ Relaxation exercises (yoga, meditation). °¨ Group therapy. °¨ Counseling. °SEEK MEDICAL CARE IF:  °· You notice certain foods cause stomach pain. °· Your home care treatment is not helping your pain. °· You need stronger pain medicine. °· You want your IUD removed. °· You feel faint or   lightheaded. °· You develop nausea and vomiting. °· You develop a rash. °· You are having side effects or an allergy to your medicine. °SEEK IMMEDIATE MEDICAL CARE IF:  °· Your  pain does not go away or gets worse. °· You have a fever. °· Your pain is felt only in portions of the abdomen. The right side could possibly be appendicitis. The left lower portion of the abdomen could be colitis or diverticulitis. °· You are passing blood in your stools (bright red or black tarry stools, with or without vomiting). °· You have blood in your urine. °· You develop chills, with or without a fever. °· You pass out. °MAKE SURE YOU:  °· Understand these instructions. °· Will watch your condition. °· Will get help right away if you are not doing well or get worse. °Document Released: 03/03/2007 Document Revised: 09/20/2013 Document Reviewed: 03/23/2009 °[employer]® Patient Information ©2015 [employer]. This information is not intended to replace advice given to you by your health care provider. Make sure you discuss any questions you have with your health care provider. ° °
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
## 2013-12-31 LAB — GC/CHLAMYDIA PROBE AMP
CT Probe RNA: NEGATIVE
GC Probe RNA: NEGATIVE

## 2013-12-31 MED ORDER — DICYCLOMINE HCL 20 MG PO TABS: 20.0000 mg | ORAL_TABLET | Freq: Two times a day (BID) | ORAL | Status: AC

## 2013-12-31 MED ORDER — METRONIDAZOLE 500 MG PO TABS: 500.0000 mg | ORAL_TABLET | Freq: Two times a day (BID) | ORAL | Status: AC

## 2014-01-01 NOTE — ED Provider Notes (Signed)
I saw and evaluated the patient, reviewed the resident's note and I agree with the findings and plan.   EKG Interpretation None     Patient abdominal pain dark stools. May be PID component. Lab work reassuring otherwise. Recent CT. Will discharge home  Juliet Rudeathan R. Rubin PayorPickering, MD 01/01/14 [phone]

## 2014-03-21 ENCOUNTER — Encounter (HOSPITAL_COMMUNITY): Payer: Self-pay | Admitting: Emergency Medicine

## 2014-04-15 ENCOUNTER — Encounter (HOSPITAL_COMMUNITY): Payer: Self-pay | Admitting: Emergency Medicine

## 2014-04-15 ENCOUNTER — Emergency Department (HOSPITAL_COMMUNITY)
Admission: EM | Admit: 2014-04-15 | Discharge: 2014-04-15 | Disposition: A | Discharge status: Home / Self Care | Payer: Medicaid Other | Attending: Emergency Medicine | Admitting: Emergency Medicine

## 2014-04-15 ENCOUNTER — Emergency Department (HOSPITAL_COMMUNITY): Payer: Medicaid Other

## 2014-04-15 DIAGNOSIS — Y998 Other external cause status: Secondary | ICD-10-CM | POA: Insufficient documentation

## 2014-04-15 DIAGNOSIS — W1839XA Other fall on same level, initial encounter: Secondary | ICD-10-CM | POA: Insufficient documentation

## 2014-04-15 DIAGNOSIS — S99921A Unspecified injury of right foot, initial encounter: Secondary | ICD-10-CM | POA: Diagnosis present

## 2014-04-15 DIAGNOSIS — Y929 Unspecified place or not applicable: Secondary | ICD-10-CM | POA: Diagnosis not present

## 2014-04-15 DIAGNOSIS — Z8709 Personal history of other diseases of the respiratory system: Secondary | ICD-10-CM | POA: Insufficient documentation

## 2014-04-15 DIAGNOSIS — Y9389 Activity, other specified: Secondary | ICD-10-CM | POA: Diagnosis not present

## 2014-04-15 DIAGNOSIS — Z792 Long term (current) use of antibiotics: Secondary | ICD-10-CM | POA: Insufficient documentation

## 2014-04-15 DIAGNOSIS — Z880 Allergy status to penicillin: Secondary | ICD-10-CM | POA: Insufficient documentation

## 2014-04-15 DIAGNOSIS — S9031XA Contusion of right foot, initial encounter: Secondary | ICD-10-CM | POA: Diagnosis not present

## 2014-04-15 DIAGNOSIS — E0590 Thyrotoxicosis, unspecified without thyrotoxic crisis or storm: Secondary | ICD-10-CM | POA: Insufficient documentation

## 2014-04-15 DIAGNOSIS — Z79899 Other long term (current) drug therapy: Secondary | ICD-10-CM | POA: Insufficient documentation

## 2014-04-15 MED ORDER — TRAMADOL HCL 50 MG PO TABS
50.0000 mg | ORAL_TABLET | Freq: Once | ORAL | Status: AC
  Administered 2014-04-15: 50 mg via ORAL
  Filled 2014-04-15: qty 1

## 2014-04-15 MED ORDER — TRAMADOL HCL 50 MG PO TABS: 50.0000 mg | ORAL_TABLET | Freq: Four times a day (QID) | ORAL | Status: AC | PRN

## 2014-04-15 NOTE — ED Notes (Signed)
Pt reports L foot pain and numbness from midfoot to inner 3 toes. Full ROM of ankle. Pain starts midfoot. Pt states she is unable to move medial 3 toes and has numbness. 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 Pt reports she fell out of heels yesterday.

## 2014-04-15 NOTE — ED Provider Notes (Signed)
CSN: [id number]     Arrival date & time 04/15/14  1704 History  This chart was scribed for non-physician practitioner, Wynetta EmeryNicole Deontrae Drinkard, PA-C,working with Tilden FossaElizabeth Rees, MD, by Karle PlumberJennifer Tensley, ED Scribe. This patient was seen in room WTR7/WTR7 and the patient's care was started at 6:21 PM.  Chief Complaint  Patient presents with  . Foot Pain   Patient is a 23 y.o. female presenting with lower extremity pain. The history is provided by the patient. No language interpreter was used.  Foot Pain    HPI Comments:  Lindsay Graham is a 23 y.o. female who presents to the Emergency Department complaining of moderate left dorsal foot pain that began two days ago. She reports associated numbness at the base of the toes. 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 States that she has almost fallen from trying to bear weight on the foot during the numbness. She reports taking Aleve with minimal relief of the pain. Bearing weight makes the pain worse. Wrapping the foot securely and tightly helps to alleviate the pain. She denies tingling, weakness, redness or warmth of the area. PMH of nocturia, frequent urination and hyperthyroidism.   Past Medical History  Diagnosis Date  . Hematuria   . PONV (postoperative nausea and vomiting)   . Frequency of urination   . Hyperthyroidism     DX 2010--  NO TX PER PT --  JUST MONITORED  . History of chronic bronchitis   . Urgency of urination   . Nocturia    Past Surgical History  Procedure Laterality Date  . Wisdom tooth extraction  X2  2014  . Intrauterine device insertion  10-25-2010    MIRENA  . Cystoscopy N/A 01/01/2013    Procedure: CYSTOSCOPY with instillation of marcaine and pyridium;  Surgeon: Lindaann SloughMarc-Henry Nesi, MD;  [facility];  Service: Urology;  Laterality: N/A;   Family History  Problem Relation Age of Onset  . Cancer Mother     cervical  . Migraines Mother   . Hypothyroidism Sister     History  Substance Use Topics  . Smoking status: Never Smoker   . Smokeless tobacco: Never Used  . Alcohol Use: Yes   OB History    Gravida Para Term Preterm AB TAB SAB Ectopic Multiple Living   2 1        1      Review of Systems  Musculoskeletal: Positive for arthralgias.  Skin: Negative for color change and wound.  Neurological: Positive for numbness. Negative for weakness.  All other systems reviewed and are negative.   Allergies  Other and Penicillins  Home Medications   Prior to Admission medications   Medication Sig Start Date End Date Taking? Authorizing Provider  albuterol (PROVENTIL HFA;VENTOLIN HFA) 108 (90 BASE) MCG/ACT inhaler Inhale 2 puffs into the lungs every 6 (six) hours as needed for wheezing or shortness of breath.    Historical Provider, MD  amitriptyline (ELAVIL) 50 MG tablet Take 50-100 mg by mouth at bedtime as needed for sleep.    Historical Provider, MD  dicyclomine (BENTYL) 20 MG tablet Take 1 tablet (20 mg total) by mouth 2 (two) times daily. 12/31/13   Gavin PoundJustin Brooten, MD  doxycycline (VIBRAMYCIN) 100 MG capsule Take 1 capsule (100 mg total) by mouth 2 (two) times daily. 12/30/13   Gavin PoundJustin Brooten, MD  HYDROcodone-acetaminophen (NORCO/VICODIN) 5-325 MG per tablet Take 1 tablet by mouth every 4 (four) hours as needed  for moderate pain or severe pain. 12/30/13   Gavin Pound, MD  ibuprofen (ADVIL,MOTRIN) 400 MG tablet Take 1 tablet (400 mg total) by mouth every 6 (six) hours as needed. 12/30/13   Gavin Pound, MD  IRON PO Take 1 tablet by mouth daily.    Historical Provider, MD  levonorgestrel (MIRENA) 20 MCG/24HR IUD 1 each by Intrauterine route once.    Historical Provider, MD  metroNIDAZOLE (FLAGYL) 500 MG tablet Take 1 tablet (500 mg total) by mouth 2 (two) times daily. 12/31/13   Gavin Pound, MD  Multiple Vitamin (MULTIVITAMIN) tablet Take 1 tablet by mouth 2 (two) times daily.     Historical Provider, MD  Naproxen Sodium (ALEVE) 220 MG CAPS Take  660 mg by mouth 2 (two) times daily as needed (for pain).     Historical Provider, MD  ondansetron (ZOFRAN) 4 MG tablet Take 1 tablet (4 mg total) by mouth every 6 (six) hours. 12/30/13   Gavin Pound, MD  traMADol (ULTRAM) 50 MG tablet Take 1 tablet (50 mg total) by mouth every 6 (six) hours as needed. 04/15/14   Jenne Sellinger, PA-C   Triage Vitals: BP 114/64 mmHg  Pulse 90  Temp(Src) 98.8 F (37.1 C) (Oral)  Resp 16  SpO2 100%  LMP 02/16/2014 Physical Exam  Constitutional: She is oriented to person, place, and time. She appears well-developed and well-nourished.  HENT:  Head: Normocephalic and atraumatic.  Eyes: EOM are normal.  Neck: Normal range of motion.  Cardiovascular: Normal rate.   Pulmonary/Chest: Effort normal.  Musculoskeletal: Normal range of motion.  Left foot with no overlying skin changes, swelling, contusion, erythema, she is neurovascularly intact. There is no focal bony tenderness to palpation, she is diffusely tender to palpation along the dorsum of the metatarsals.  Neurological: She is alert and oriented to person, place, and time.  Skin: Skin is warm and dry.  Psychiatric: She has a normal mood and affect. Her behavior is normal.  Nursing note and vitals reviewed.   ED Course  Procedures (including critical care time) DIAGNOSTIC STUDIES: Oxygen Saturation is 100% on RA, normal by my interpretation.   COORDINATION OF CARE: 6:25 PM- Informed pt to RICE foot. Will order ace wrap and crutches. Will prescribe pain medication and advised pt to continue taking Aleve. Pt verbalizes understanding and agrees to plan.  Medications  traMADol (ULTRAM) tablet 50 mg (50 mg Oral Given 04/15/14 1858)    Labs Review Labs Reviewed - No data to display  Imaging Review Dg Foot Complete Left  04/15/2014   CLINICAL DATA:  Left foot pain and swelling for 2 days.  EXAM: LEFT FOOT - COMPLETE 3+ VIEW  COMPARISON:  Left ankle 02/07/2012  FINDINGS: There is no evidence of  fracture or dislocation. There is no evidence of arthropathy or other focal bone abnormality. Soft tissues are unremarkable.  IMPRESSION: No acute abnormality.   Electronically Signed   By: Richarda Overlie M.D.   On: 04/15/2014 18:14     EKG Interpretation None      MDM   Final diagnoses:  Foot contusion, right, initial encounter    Filed Vitals:   04/15/14 1719 04/15/14 1859  BP: 114/64 123/69  Pulse: 90 96  Temp: 98.8 F (37.1 C)   TempSrc: Oral   Resp: 16 16  SpO2: 100% 97%    Medications  traMADol (ULTRAM) tablet 50 mg (50 mg Oral Given 04/15/14 1858)    Lindsay Graham is a 23 y.o. female presenting with  left foot pain associated with paresthesias. 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 She was walking in high heels and she fell yesterday. X-ray with no acute abnormalities. Patient will be given crutches and recommend rest, ice, compression elevation for foot contusion.  Evaluation does not show pathology that would require ongoing emergent intervention or inpatient treatment. Pt is hemodynamically stable and mentating appropriately. Discussed findings and plan with patient/guardian, who agrees with care plan. All questions answered. Return precautions discussed and outpatient follow up given.   Discharge Medication List as of 04/15/2014  6:38 PM    START taking these medications   Details  traMADol (ULTRAM) 50 MG tablet Take 1 tablet (50 mg total) by mouth every 6 (six) hours as needed., Starting 04/15/2014, Until Discontinued, Print           I personally performed the services described in this documentation, which was scribed in my presence. The recorded information has been reviewed and is accurate.    Wynetta Emeryicole Jefry Lesinski, PA-C 04/15/14 1956  Tilden FossaElizabeth Rees, MD 04/16/14 Moses [id number]

## 2014-04-15 NOTE — Discharge Instructions (Signed)
Rest, Ice intermittently (in the first 24-48 hours), Gentle compression with an Ace wrap, and elevate (Limb above the level of the heart)   Take up to 800mg  of ibuprofen (that is usually 4 over the counter pills)  3 times a day for 5 days. Take with food.  For pain control you may take:  800mg  of ibuprofen (that is usually 4 over the counter pills)  3 times a day (take with food) and acetaminophen 975mg  (this is 3 over the counter pills) four times a day. Do not drink alcohol or combine with other medications that have acetaminophen as an ingredient (Read the labels!).  For breakthrough pain you may take Tramadol. Do not drink alcohol drive or operate heavy machinery when taking Tramadol.  Rest, Ice intermittently (in the first 24-48 hours), Gentle compression with an Ace wrap, and elevate (Limb above the level of the heart)   Take up to 800mg  of ibuprofen (that is usually 4 over the counter pills)  3 times a day for 5 days. Take with food.    Contusion A contusion is a deep bruise. Contusions happen when an injury causes bleeding under the skin. Signs of bruising include pain, puffiness (swelling), and discolored skin. The contusion may turn blue, purple, or yellow. HOME CARE   Put ice on the injured area.  Put ice in a plastic bag.  Place a towel between your skin and the bag.  Leave the ice on for 15-20 minutes, 03-04 times a day.  Only take medicine as told by your doctor.  Rest the injured area.  If possible, raise (elevate) the injured area to lessen puffiness. GET HELP RIGHT AWAY IF:   You have more bruising or puffiness.  You have pain that is getting worse.  Your puffiness or pain is not helped by medicine. MAKE SURE YOU:   Understand these instructions.  Will watch your condition.  Will get help right away if you are not doing well or get worse. Document Released: 10/23/2007 Document Revised: 07/29/2011 Document Reviewed: 03/11/2011 [facility] Patient Information  2015 [place], [place]. This information is not intended to replace advice given to you by your health care provider. Make sure you discuss any questions you have with your health care provider.

## 2014-08-11 ENCOUNTER — Emergency Department (HOSPITAL_COMMUNITY)
Admission: EM | Admit: 2014-08-11 | Discharge: 2014-08-12 | Disposition: A | Discharge status: Home / Self Care | Payer: Medicaid Other | Attending: Emergency Medicine | Admitting: Emergency Medicine

## 2014-08-11 ENCOUNTER — Encounter (HOSPITAL_COMMUNITY): Payer: Self-pay | Admitting: Emergency Medicine

## 2014-08-11 ENCOUNTER — Emergency Department (HOSPITAL_COMMUNITY): Payer: Medicaid Other

## 2014-08-11 DIAGNOSIS — M791 Myalgia: Secondary | ICD-10-CM | POA: Diagnosis not present

## 2014-08-11 DIAGNOSIS — Z880 Allergy status to penicillin: Secondary | ICD-10-CM | POA: Insufficient documentation

## 2014-08-11 DIAGNOSIS — R509 Fever, unspecified: Secondary | ICD-10-CM | POA: Diagnosis present

## 2014-08-11 DIAGNOSIS — R000 Tachycardia, unspecified: Secondary | ICD-10-CM | POA: Diagnosis not present

## 2014-08-11 DIAGNOSIS — R1111 Vomiting without nausea: Secondary | ICD-10-CM | POA: Insufficient documentation

## 2014-08-11 DIAGNOSIS — R51 Headache: Secondary | ICD-10-CM | POA: Insufficient documentation

## 2014-08-11 DIAGNOSIS — R05 Cough: Secondary | ICD-10-CM | POA: Diagnosis not present

## 2014-08-11 DIAGNOSIS — Z8639 Personal history of other endocrine, nutritional and metabolic disease: Secondary | ICD-10-CM | POA: Diagnosis not present

## 2014-08-11 DIAGNOSIS — J029 Acute pharyngitis, unspecified: Secondary | ICD-10-CM | POA: Insufficient documentation

## 2014-08-11 DIAGNOSIS — R6889 Other general symptoms and signs: Secondary | ICD-10-CM

## 2014-08-11 DIAGNOSIS — Z79899 Other long term (current) drug therapy: Secondary | ICD-10-CM | POA: Insufficient documentation

## 2014-08-11 NOTE — ED Notes (Signed)
Pt reports waking up this morning with generalized body aches, fever, fatigue, and vomiting.  Pt took Tylenol just prior to arrival.

## 2014-08-12 LAB — CBC WITH DIFFERENTIAL/PLATELET
BASOS PCT: 0 % (ref 0–1)
Basophils Absolute: 0 10*3/uL (ref 0.0–0.1)
Eosinophils Absolute: 0 10*3/uL (ref 0.0–0.7)
Eosinophils Relative: 0 % (ref 0–5)
HCT: 34.8 % — ABNORMAL LOW (ref 36.0–46.0)
HEMOGLOBIN: 11.2 g/dL — AB (ref 12.0–15.0)
Lymphocytes Relative: 11 % — ABNORMAL LOW (ref 12–46)
Lymphs Abs: 1.1 10*3/uL (ref 0.7–4.0)
MCH: 29.2 pg (ref 26.0–34.0)
MCHC: 32.2 g/dL (ref 30.0–36.0)
MCV: 90.9 fL (ref 78.0–100.0)
MONO ABS: 0.7 10*3/uL (ref 0.1–1.0)
MONOS PCT: 8 % (ref 3–12)
Neutro Abs: 7.9 10*3/uL — ABNORMAL HIGH (ref 1.7–7.7)
Neutrophils Relative %: 81 % — ABNORMAL HIGH (ref 43–77)
Platelets: 253 10*3/uL (ref 150–400)
RBC: 3.83 MIL/uL — ABNORMAL LOW (ref 3.87–5.11)
RDW: 13.9 % (ref 11.5–15.5)
WBC: 9.7 10*3/uL (ref 4.0–10.5)

## 2014-08-12 LAB — URINE MICROSCOPIC-ADD ON

## 2014-08-12 LAB — COMPREHENSIVE METABOLIC PANEL
ALT: 20 U/L (ref 0–35)
AST: 23 U/L (ref 0–37)
Albumin: 4.3 g/dL (ref 3.5–5.2)
Alkaline Phosphatase: 44 U/L (ref 39–117)
Anion gap: 8 (ref 5–15)
BUN: 8 mg/dL (ref 6–23)
CO2: 24 mmol/L (ref 19–32)
Calcium: 9.2 mg/dL (ref 8.4–10.5)
Chloride: 103 mmol/L (ref 96–112)
Creatinine, Ser: 0.82 mg/dL (ref 0.50–1.10)
GFR calc Af Amer: >90 mL/min (ref >90)
GFR calc non Af Amer: >90 mL/min (ref >90)
Glucose, Bld: 94 mg/dL (ref 70–99)
POTASSIUM: 3.7 mmol/L (ref 3.5–5.1)
Sodium: 135 mmol/L (ref 135–145)
TOTAL PROTEIN: 7.2 g/dL (ref 6.0–8.3)
Total Bilirubin: 0.7 mg/dL (ref 0.3–1.2)

## 2014-08-12 LAB — URINALYSIS, ROUTINE W REFLEX MICROSCOPIC
Bilirubin Urine: NEGATIVE
Glucose, UA: NEGATIVE mg/dL
Ketones, ur: 15 mg/dL — AB
LEUKOCYTES UA: NEGATIVE
Nitrite: NEGATIVE
PH: 6 (ref 5.0–8.0)
Protein, ur: NEGATIVE mg/dL
Specific Gravity, Urine: 1.023 (ref 1.005–1.030)
Urobilinogen, UA: 0.2 mg/dL (ref 0.0–1.0)

## 2014-08-12 LAB — INFLUENZA PANEL BY PCR (TYPE A & B)
H1N1 flu by pcr: NOT DETECTED
INFLBPCR: NEGATIVE
Influenza A By PCR: NEGATIVE

## 2014-08-12 MED ORDER — ONDANSETRON HCL 4 MG PO TABS: 4.0000 mg | ORAL_TABLET | Freq: Four times a day (QID) | ORAL | Status: AC

## 2014-08-12 MED ORDER — SODIUM CHLORIDE 0.9 % IV BOLUS (SEPSIS)
1000.0000 mL | Freq: Once | INTRAVENOUS | Status: AC
  Administered 2014-08-12: 1000 mL via INTRAVENOUS

## 2014-08-12 MED ORDER — SODIUM CHLORIDE 0.9 % IV BOLUS (SEPSIS)
1000.0000 mL | INTRAVENOUS | Status: AC
  Administered 2014-08-12: 1000 mL via INTRAVENOUS

## 2014-08-12 MED ORDER — NAPROXEN 500 MG PO TABS: 500.0000 mg | ORAL_TABLET | Freq: Two times a day (BID) | ORAL | Status: AC

## 2014-08-12 MED ORDER — OSELTAMIVIR PHOSPHATE 75 MG PO CAPS: 75.0000 mg | ORAL_CAPSULE | Freq: Two times a day (BID) | ORAL | Status: AC

## 2014-08-12 MED ORDER — ONDANSETRON HCL 4 MG/2ML IJ SOLN
4.0000 mg | Freq: Once | INTRAMUSCULAR | Status: AC
  Administered 2014-08-12: 4 mg via INTRAVENOUS
  Filled 2014-08-12: qty 2

## 2014-08-12 MED ORDER — KETOROLAC TROMETHAMINE 30 MG/ML IJ SOLN
30.0000 mg | Freq: Once | INTRAMUSCULAR | Status: AC
  Administered 2014-08-12: 30 mg via INTRAVENOUS
  Filled 2014-08-12: qty 1

## 2014-08-12 MED ORDER — ACETAMINOPHEN 325 MG PO TABS
650.0000 mg | ORAL_TABLET | Freq: Once | ORAL | Status: AC
  Administered 2014-08-12: 650 mg via ORAL
  Filled 2014-08-12: qty 2

## 2014-08-12 NOTE — ED Notes (Signed)
PA Beth in room with pt 

## 2014-08-12 NOTE — ED Notes (Addendum)
PA Tysinger in room and advised not to swab for strep.

## 2014-08-12 NOTE — ED Notes (Signed)
Pt verbalized understanding of d/c instructions and has no further questions. Pt's VSS and no distress at d/c

## 2014-08-12 NOTE — ED Provider Notes (Signed)
CSN: [id number]     Arrival date & time 08/11/14  2319 History   First MD Initiated Contact with Patient 08/11/14 2359     Chief Complaint  Patient presents with  . Fever  . Emesis   (Consider location/radiation/quality/duration/timing/severity/associated sxs/prior Treatment) HPI   Lindsay Graham is a 24 yo female presenting with report of generalized body aches, and fever.  She states the symptoms began this morning and have progressively worsened throughout the day. She reports 2 episodes of vomiting also today.  She has a mild dry cough and associated headache and sore throat.  She currently describes her pain as aching throughout her muscles and rates as 8/10.  She denies any bilious or bloody emesis, abd pain or diarrhea.    Past Medical History  Diagnosis Date  . Hematuria   . PONV (postoperative nausea and vomiting)   . Frequency of urination   . Hyperthyroidism     DX 2010--  NO TX PER PT --  JUST MONITORED  . History of chronic bronchitis   . Urgency of urination   . Nocturia    Past Surgical History  Procedure Laterality Date  . Wisdom tooth extraction  X2  2014  . Intrauterine device insertion  10-25-2010    MIRENA  . Cystoscopy N/A 01/01/2013    Procedure: CYSTOSCOPY with instillation of marcaine and pyridium;  Surgeon: Lindaann SloughMarc-Henry Nesi, MD;  [facility];  Service: Urology;  Laterality: N/A;   Family History  Problem Relation Age of Onset  . Cancer Mother     cervical  . Migraines Mother   . Hypothyroidism Sister    History  Substance Use Topics  . Smoking status: Never Smoker   . Smokeless tobacco: Never Used  . Alcohol Use: Yes   OB History    Gravida Para Term Preterm AB TAB SAB Ectopic Multiple Living   2 1        1      Review of Systems  Constitutional: Positive for fever and chills.  HENT: Positive for sore throat.   Eyes: Negative for visual disturbance.  Respiratory: Positive for cough. Negative for shortness of  breath.   Cardiovascular: Negative for chest pain and leg swelling.  Gastrointestinal: Positive for vomiting. Negative for nausea and diarrhea.  Genitourinary: Negative for dysuria.  Musculoskeletal: Positive for myalgias.  Skin: Negative for rash.  Neurological: Positive for headaches. Negative for weakness and numbness.      Allergies  Amoxicillin; Penicillins; and Other  Home Medications   Prior to Admission medications   Medication Sig Start Date End Date Taking? Authorizing Provider  albuterol (PROVENTIL HFA;VENTOLIN HFA) 108 (90 BASE) MCG/ACT inhaler Inhale 2 puffs into the lungs every 6 (six) hours as needed for wheezing or shortness of breath.   Yes Historical Provider, MD  HYDROcodone-acetaminophen (NORCO/VICODIN) 5-325 MG per tablet Take 1 tablet by mouth every 4 (four) hours as needed for moderate pain or severe pain. 12/30/13  Yes Gavin PoundJustin Brooten, MD  ibuprofen (ADVIL,MOTRIN) 400 MG tablet Take 1 tablet (400 mg total) by mouth every 6 (six) hours as needed. 12/30/13  Yes Gavin PoundJustin Brooten, MD  IRON PO Take 1 tablet by mouth daily.   Yes Historical Provider, MD  Multiple Vitamin (MULTIVITAMIN) tablet Take 1 tablet by mouth 2 (two) times daily.    Yes Historical Provider, MD  Naproxen Sodium (ALEVE) 220 MG CAPS Take 660 mg by mouth 2 (two) times daily as needed (for pain).    Yes  Historical Provider, MD  dicyclomine (BENTYL) 20 MG tablet Take 1 tablet (20 mg total) by mouth 2 (two) times daily. Patient not taking: Reported on 08/12/2014 12/31/13   Gavin Pound, MD  doxycycline (VIBRAMYCIN) 100 MG capsule Take 1 capsule (100 mg total) by mouth 2 (two) times daily. Patient not taking: Reported on 08/12/2014 12/30/13   Gavin Pound, MD  metroNIDAZOLE (FLAGYL) 500 MG tablet Take 1 tablet (500 mg total) by mouth 2 (two) times daily. Patient not taking: Reported on 08/12/2014 12/31/13   Gavin Pound, MD  ondansetron (ZOFRAN) 4 MG tablet Take 1 tablet (4 mg total) by mouth every 6 (six)  hours. Patient not taking: Reported on 08/12/2014 12/30/13   Gavin Pound, MD  traMADol (ULTRAM) 50 MG tablet Take 1 tablet (50 mg total) by mouth every 6 (six) hours as needed. Patient not taking: Reported on 08/12/2014 04/15/14   Joni Reining Pisciotta, PA-C   BP 116/68 mmHg  Pulse 112  Temp(Src) 101.3 F (38.5 C) (Oral)  Resp 20  Ht  (1.473 m)  Wt 115 lb (52.164 kg)  BMI 24.04 kg/m2  SpO2 98%  LMP 07/22/2014 Physical Exam  Constitutional: She appears well-developed and well-nourished. No distress.  HENT:  Head: Normocephalic and atraumatic.  Mouth/Throat: Oropharynx is clear and moist.  Mild oropharyngeal swellling, but no edema or exudate  Eyes: Conjunctivae are normal.  Neck: Neck supple.  Cardiovascular: Regular rhythm, S1 normal, S2 normal and intact distal pulses.  Tachycardia present.   Pulmonary/Chest: Effort normal and breath sounds normal. No respiratory distress. She has no wheezes. She has no rales. She exhibits no tenderness.  Abdominal: Soft. She exhibits no distension and no mass. There is no tenderness. There is no rebound and no guarding.  Musculoskeletal: She exhibits no tenderness.  Lymphadenopathy:    She has no cervical adenopathy.  Neurological: She is alert.  Skin: Skin is warm and dry. No rash noted. She is not diaphoretic.  Psychiatric: She has a normal mood and affect.  Nursing note and vitals reviewed.   ED Course  Procedures (including critical care time) Labs Review Labs Reviewed  CBC WITH DIFFERENTIAL/PLATELET - Abnormal; Notable for the following:    RBC 3.83 (*)    Hemoglobin 11.2 (*)    HCT 34.8 (*)    Neutrophils Relative % 81 (*)    Neutro Abs 7.9 (*)    Lymphocytes Relative 11 (*)    All other components within normal limits  URINALYSIS, ROUTINE W REFLEX MICROSCOPIC - Abnormal; Notable for the following:    Hgb urine dipstick LARGE (*)    Ketones, ur 15 (*)    All other components within normal limits  COMPREHENSIVE METABOLIC  PANEL  URINE MICROSCOPIC-ADD ON  INFLUENZA PANEL BY PCR (TYPE A & B, H1N1)    Imaging Review Dg Chest 1 View  08/12/2014   CLINICAL DATA:  Fever and body aches.  EXAM: CHEST  1 VIEW  COMPARISON:  07/24/2014  FINDINGS: Decreased bronchial wall thickening from prior. The cardiomediastinal contours are normal. The lungs are clear. Pulmonary vasculature is normal. No consolidation, pleural effusion, or pneumothorax. Suggestion of minimal callus formation about the right lateral fifth rib.  IMPRESSION: 1. Decreased bronchial thickening from prior.  The lungs are clear. 2. Question of callus formation about right lateral fifth rib, may reflect subacute fracture.   Electronically Signed   By: Rubye Oaks M.D.   On: 08/12/2014 00:21     EKG Interpretation None  MDM   Final diagnoses:  Flu-like symptoms   24 yo with cough, sore throat, fever and muscle aches onset today. CBC, CMP, and UA done from triage show a normal WBC, consistent with a viral illness, and ketones in the urine most likely related to dehydration. Tachycardia normalized after 2 LNS bolus, and pt reports feeling better after toradol and zofran.  CXR normal for infiltrate, pt has no tenderness over area suggested area of subacute fracture. Pt is well-appearing, in no acute distress and vital signs reviewed and not concerning. She appears safe to be discharged.  Discharge include follow-up with their PCP.  Return precautions provided. Pt aware of plan and in agreement.    Filed Vitals:   08/12/14 0300 08/12/14 0345 08/12/14 0350 08/12/14 0421  BP: 99/53  101/45 100/64  Pulse: 84  79 82  Temp:  98.1 F (36.7 C)  98.1 F (36.7 C)  TempSrc:  Oral    Resp:    16  Height:      Weight:      SpO2: 97%  99% 100%   Meds given in ED:  Medications  sodium chloride 0.9 % bolus 1,000 mL (0 mLs Intravenous Stopped 08/12/14 0136)  ketorolac (TORADOL) 30 MG/ML injection 30 mg (30 mg Intravenous Given 08/12/14 0052)  ondansetron  (ZOFRAN) injection 4 mg (4 mg Intravenous Given 08/12/14 0052)  acetaminophen (TYLENOL) tablet 650 mg (650 mg Oral Given 08/12/14 0058)  sodium chloride 0.9 % bolus 1,000 mL (0 mLs Intravenous Stopped 08/12/14 0310)    Discharge Medication List as of 08/12/2014  4:13 AM    START taking these medications   Details  naproxen (NAPROSYN) 500 MG tablet Take 1 tablet (500 mg total) by mouth 2 (two) times daily., Starting 08/12/2014, Until Discontinued, Print    oseltamivir (TAMIFLU) 75 MG capsule Take 1 capsule (75 mg total) by mouth every 12 (twelve) hours., Starting 08/12/2014, Until Discontinued, Print           Harle Battiest, NP 08/13/14 1144  Shon Baton, MD 08/17/14 1310

## 2014-08-12 NOTE — Discharge Instructions (Signed)
Please follow the directions provided. Be sure to follow-up with your primary care doctor to ensure you're getting better. Take the Tamiflu as directed until it is gone. You may use the naproxen twice a day to help with fever and discomfort. Uses Zofran for any nausea or vomiting. Be sure to drink plenty of fluids by mouth to stay well hydrated. Don't hesitate to return for any new, worsening, or concerning symptoms.   SEEK IMMEDIATE MEDICAL CARE IF:  You have trouble breathing, you become short of breath, or your skin or nails become bluish.  You have severe pain or stiffness in the neck.  You develop a sudden headache, or pain in the face or ear.  You have nausea or vomiting that you cannot control.

## 2014-08-12 NOTE — ED Notes (Signed)
Pt ambulated around the hall and back. o2 sats above 97% and HR 90-100 while ambulating

## 2014-09-25 ENCOUNTER — Emergency Department (HOSPITAL_COMMUNITY): Payer: Medicaid Other

## 2014-09-25 ENCOUNTER — Emergency Department (HOSPITAL_COMMUNITY)
Admission: EM | Admit: 2014-09-25 | Discharge: 2014-09-25 | Disposition: A | Discharge status: Home / Self Care | Payer: Medicaid Other | Attending: Emergency Medicine | Admitting: Emergency Medicine

## 2014-09-25 ENCOUNTER — Encounter (HOSPITAL_COMMUNITY): Payer: Self-pay | Admitting: *Deleted

## 2014-09-25 DIAGNOSIS — R091 Pleurisy: Secondary | ICD-10-CM

## 2014-09-25 DIAGNOSIS — Z791 Long term (current) use of non-steroidal anti-inflammatories (NSAID): Secondary | ICD-10-CM | POA: Diagnosis not present

## 2014-09-25 DIAGNOSIS — Z880 Allergy status to penicillin: Secondary | ICD-10-CM | POA: Insufficient documentation

## 2014-09-25 DIAGNOSIS — J189 Pneumonia, unspecified organism: Secondary | ICD-10-CM | POA: Diagnosis not present

## 2014-09-25 DIAGNOSIS — M546 Pain in thoracic spine: Secondary | ICD-10-CM | POA: Diagnosis present

## 2014-09-25 DIAGNOSIS — Z79899 Other long term (current) drug therapy: Secondary | ICD-10-CM | POA: Diagnosis not present

## 2014-09-25 MED ORDER — HYDROCODONE-ACETAMINOPHEN 5-325 MG PO TABS: 2.0000 | ORAL_TABLET | ORAL | Status: AC | PRN

## 2014-09-25 MED ORDER — PREDNISONE 10 MG PO TABS: 20.0000 mg | ORAL_TABLET | Freq: Two times a day (BID) | ORAL | Status: AC

## 2014-09-25 MED ORDER — KETOROLAC TROMETHAMINE 60 MG/2ML IM SOLN
60.0000 mg | Freq: Once | INTRAMUSCULAR | Status: AC
  Administered 2014-09-25: 60 mg via INTRAMUSCULAR
  Filled 2014-09-25: qty 2

## 2014-09-25 NOTE — Discharge Instructions (Signed)
Prednisone as prescribed.  Hydrocodone as prescribed as needed for pain.  Return to the ER if you develop worsening breathing, high fever, productive cough, or other new and concerning symptoms.   Pleurisy Pleurisy is an inflammation and swelling of the lining of the lungs (pleura). Because of this inflammation, it hurts to breathe. It can be aggravated by coughing, laughing, or deep breathing. Pleurisy is often caused by an underlying infection or disease.  HOME CARE INSTRUCTIONS  Monitor your pleurisy for any changes. The following actions may help to alleviate any discomfort you are experiencing:  Medicine may help with pain. Only take over-the-counter or prescription medicines for pain, discomfort, or fever as directed by your health care provider.  Only take antibiotic medicine as directed. Make sure to finish it even if you start to feel better. SEEK MEDICAL CARE IF:   Your pain is not controlled with medicine or is increasing.  You have an increase in pus-like (purulent) secretions brought up with coughing. SEEK IMMEDIATE MEDICAL CARE IF:   You have blue or dark lips, fingernails, or toenails.  You are coughing up blood.  You have increased difficulty breathing.  You have continuing pain unrelieved by medicine or pain lasting more than 1 week.  You have pain that radiates into your neck, arms, or jaw.  You develop increased shortness of breath or wheezing.  You develop a fever, rash, vomiting, fainting, or other serious symptoms. MAKE SURE YOU:  Understand these instructions.   Will watch your condition.   Will get help right away if you are not doing well or get worse.  Document Released: 05/06/2005 Document Revised: 01/06/2013 Document Reviewed: 10/18/2012 [facility] Patient Information 2015 [place], [place]. This information is not intended to replace advice given to you by your health care provider. Make sure you discuss any questions you have with your health  care provider.

## 2014-09-25 NOTE — ED Notes (Signed)
Pt. Left with all belongings and refused wheelchair 

## 2014-09-25 NOTE — ED Notes (Signed)
Patient presents stating that she has back pain to the left.  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

## 2014-09-25 NOTE — ED Provider Notes (Signed)
CSN: [id number]     Arrival date & time 09/25/14  0243 History  This chart was scribed for Geoffery Lyonsouglas Jamone Garrido, MD by Evon Slackerrance Branch, ED Scribe. This patient was seen in room A09C/A09C and the patient's care was started at 3:43 AM.     Chief Complaint  Patient presents with  . Back Pain   Patient is a 24 y.o. female presenting with back pain. The history is provided by the patient. No language interpreter was used.  Back Pain Location:  Thoracic spine Pain severity:  Mild Onset quality:  Gradual Duration:  2 weeks Timing:  Constant Progression:  Worsening Chronicity:  New Relieved by:  Nothing Worsened by:  Deep breathing Ineffective treatments:  NSAIDs, narcotics and ibuprofen Associated symptoms: no fever and no weakness    HPI Comments: Lindsay Graham is a 24 y.o. female who presents to the Emergency Department complaining of left sided mid back pain onset 2 weeks prior. 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 Pt states that the pain is non radiating. Pt sttes she has tried tylenol and ibuprofen with no relief. Pt states he has had hydrocodone PTA with no relief as well.  Pt states that the pain is worse with movement and deep breathing. Pt denies fall or trauma. Pt denies weakness, cough, or SOB.   Past Medical History  Diagnosis Date  . Hematuria   . PONV (postoperative nausea and vomiting)   . Frequency of urination   . Hyperthyroidism     DX 2010--  NO TX PER PT --  JUST MONITORED  . History of chronic bronchitis   . Urgency of urination   . Nocturia    Past Surgical History  Procedure Laterality Date  . Wisdom tooth extraction  X2  2014  . Intrauterine device insertion  10-25-2010    MIRENA  . Cystoscopy N/A 01/01/2013    Procedure: CYSTOSCOPY with instillation of marcaine and pyridium;  Surgeon: Lindaann SloughMarc-Henry Nesi, MD;  [facility];  Service: Urology;  Laterality: N/A;   Family  History  Problem Relation Age of Onset  . Cancer Mother     cervical  . Migraines Mother   . Hypothyroidism Sister    History  Substance Use Topics  . Smoking status: Never Smoker   . Smokeless tobacco: Never Used  . Alcohol Use: Yes   OB History    Gravida Para Term Preterm AB TAB SAB Ectopic Multiple Living   2 1        1       Review of Systems  Constitutional: Negative for fever.  Respiratory: Negative for cough and shortness of breath.   Musculoskeletal: Positive for back pain.  Neurological: Negative for weakness.  All other systems reviewed and are negative.    Allergies  Amoxicillin; Penicillins; and Other  Home Medications   Prior to Admission medications   Medication Sig Start Date End Date Taking? Authorizing Provider  albuterol (PROVENTIL HFA;VENTOLIN HFA) 108 (90 BASE) MCG/ACT inhaler Inhale 2 puffs into the lungs every 6 (six) hours as needed for wheezing or shortness of breath.    Historical Provider, MD  dicyclomine (BENTYL) 20 MG tablet Take 1 tablet (20 mg total) by mouth 2 (two) times daily. Patient not taking: Reported on 08/12/2014 12/31/13   Gavin PoundJustin Brooten, MD  doxycycline (VIBRAMYCIN) 100 MG capsule Take 1 capsule (100 mg total) by mouth 2 (two)  times daily. Patient not taking: Reported on 08/12/2014 12/30/13   Gavin Pound, MD  HYDROcodone-acetaminophen (NORCO/VICODIN) 5-325 MG per tablet Take 1 tablet by mouth every 4 (four) hours as needed for moderate pain or severe pain. 12/30/13   Gavin Pound, MD  ibuprofen (ADVIL,MOTRIN) 400 MG tablet Take 1 tablet (400 mg total) by mouth every 6 (six) hours as needed. 12/30/13   Gavin Pound, MD  IRON PO Take 1 tablet by mouth daily.    Historical Provider, MD  metroNIDAZOLE (FLAGYL) 500 MG tablet Take 1 tablet (500 mg total) by mouth 2 (two) times daily. Patient not taking: Reported on 08/12/2014 12/31/13   Gavin Pound, MD  Multiple Vitamin (MULTIVITAMIN) tablet Take 1 tablet by mouth 2 (two) times daily.      Historical Provider, MD  naproxen (NAPROSYN) 500 MG tablet Take 1 tablet (500 mg total) by mouth 2 (two) times daily. 08/12/14   Harle Battiest, NP  Naproxen Sodium (ALEVE) 220 MG CAPS Take 660 mg by mouth 2 (two) times daily as needed (for pain).     Historical Provider, MD  ondansetron (ZOFRAN) 4 MG tablet Take 1 tablet (4 mg total) by mouth every 6 (six) hours. 08/12/14   Harle Battiest, NP  oseltamivir (TAMIFLU) 75 MG capsule Take 1 capsule (75 mg total) by mouth every 12 (twelve) hours. 08/12/14   Harle Battiest, NP  traMADol (ULTRAM) 50 MG tablet Take 1 tablet (50 mg total) by mouth every 6 (six) hours as needed. Patient not taking: Reported on 08/12/2014 04/15/14   Joni Reining Pisciotta, PA-C   BP 101/56 mmHg  Pulse 87  Temp(Src) 98.1 F (36.7 C) (Oral)  Resp 18  Ht  (1.473 m)  Wt 118 lb 6.4 oz (53.706 kg)  BMI 24.75 kg/m2  SpO2 98%  LMP 09/18/2014   Physical Exam  Constitutional: She is oriented to person, place, and time. She appears well-developed and well-nourished. No distress.  HENT:  Head: Normocephalic and atraumatic.  Eyes: Conjunctivae and EOM are normal.  Neck: Neck supple. No tracheal deviation present.  Cardiovascular: Normal rate.   Pulmonary/Chest: Effort normal and breath sounds normal. No respiratory distress.  Musculoskeletal: Normal range of motion.  TTP in the left upper back.   Neurological: She is alert and oriented to person, place, and time.  DTR's are 1+ and symmetrical int BLE. Strength 5/5. Able to walk without difficulty.   Skin: Skin is warm and dry.  Psychiatric: She has a normal mood and affect. Her behavior is normal.  Nursing note and vitals reviewed.   ED Course  Procedures (including critical care time) DIAGNOSTIC STUDIES: Oxygen Saturation is 98% on RA, normal by my interpretation.    COORDINATION OF CARE: 3:53 AM-Discussed treatment plan with pt at bedside and pt agreed to plan.     Labs Review Labs Reviewed - No  data to display  Imaging Review No results found.   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 She is tender to palpation in the left upper back and there is no hypoxia, tachycardia, or pleuritic component that makes me think of a PE. Her chest x-ray reveals possibly pneumonitis in the left upper lobe, however no infiltrate or other acute abnormality. I have explained this  result to the patient and informed her that I will treat her with prednisone twice daily for the next 3 days. She will also receive a small quantity of pain medication. She is to follow-up with her primary Dr. if not improving and return to the ER if she worsens.   I personally performed the services described in this documentation, which was scribed in my presence. The recorded information has been reviewed and is accurate.      Geoffery Lyonsouglas Ashawnti Tangen, MD 09/25/14 [phone]

## 2014-09-26 ENCOUNTER — Encounter (HOSPITAL_COMMUNITY): Payer: Self-pay | Admitting: Emergency Medicine

## 2014-09-26 ENCOUNTER — Emergency Department (HOSPITAL_COMMUNITY)
Admission: EM | Admit: 2014-09-26 | Discharge: 2014-09-26 | Disposition: A | Discharge status: Home / Self Care | Payer: Medicaid Other | Attending: Emergency Medicine | Admitting: Emergency Medicine

## 2014-09-26 ENCOUNTER — Emergency Department (HOSPITAL_COMMUNITY): Payer: Medicaid Other

## 2014-09-26 DIAGNOSIS — Z79899 Other long term (current) drug therapy: Secondary | ICD-10-CM | POA: Insufficient documentation

## 2014-09-26 DIAGNOSIS — Z3202 Encounter for pregnancy test, result negative: Secondary | ICD-10-CM | POA: Diagnosis not present

## 2014-09-26 DIAGNOSIS — R091 Pleurisy: Secondary | ICD-10-CM | POA: Diagnosis present

## 2014-09-26 DIAGNOSIS — Z8639 Personal history of other endocrine, nutritional and metabolic disease: Secondary | ICD-10-CM | POA: Insufficient documentation

## 2014-09-26 DIAGNOSIS — Z792 Long term (current) use of antibiotics: Secondary | ICD-10-CM | POA: Diagnosis not present

## 2014-09-26 DIAGNOSIS — Z7952 Long term (current) use of systemic steroids: Secondary | ICD-10-CM | POA: Diagnosis not present

## 2014-09-26 DIAGNOSIS — Z880 Allergy status to penicillin: Secondary | ICD-10-CM | POA: Insufficient documentation

## 2014-09-26 DIAGNOSIS — Z8709 Personal history of other diseases of the respiratory system: Secondary | ICD-10-CM | POA: Diagnosis not present

## 2014-09-26 DIAGNOSIS — M7918 Myalgia, other site: Secondary | ICD-10-CM

## 2014-09-26 DIAGNOSIS — R0789 Other chest pain: Secondary | ICD-10-CM | POA: Insufficient documentation

## 2014-09-26 DIAGNOSIS — M791 Myalgia: Secondary | ICD-10-CM | POA: Insufficient documentation

## 2014-09-26 LAB — POC URINE PREG, ED: Preg Test, Ur: NEGATIVE

## 2014-09-26 MED ORDER — METHOCARBAMOL 500 MG PO TABS: 500.0000 mg | ORAL_TABLET | Freq: Two times a day (BID) | ORAL | Status: AC

## 2014-09-26 NOTE — ED Provider Notes (Signed)
CSN: [id number]     Arrival date & time 09/26/14  1550 History   First MD Initiated Contact with Patient 09/26/14 1824     Chief Complaint  Patient presents with  . Pleurisy     HPI   24 year old female presents today with back pain, chest wall pain. 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 Persistent new profession for her. She states that she was seen in the emergency room 2 days ago and diagnosed with pleurisy, given prednisone and discharged home. She reports she been taking prednisone yesterday, but reports that she hasn't had any improvement in her pain and has begun to develop bilateral superior anterior chest wall pain. She reports she tried ibuprofen and Tylenol at home with no resolve. Her stay when she takes a deep breath she has pain, causing her to be "short of breath". She denies shortness of breath without deep inspiration, denies heart palpitations, dizziness, though shortly swelling edema, recent Rolando immobilization, malignancy, fever, surgery, or any other concerning risk factor. She denies any history of cardiac disease in her or her family, denies trauma.  Past Medical History  Diagnosis Date  . Hematuria   . PONV (postoperative nausea and vomiting)   . Frequency of urination   . Hyperthyroidism     DX 2010--  NO TX PER PT --  JUST MONITORED  . History of chronic bronchitis   . Urgency of urination   . Nocturia    Past Surgical History  Procedure Laterality Date  . Wisdom tooth extraction  X2  2014  . Intrauterine device insertion  10-25-2010    MIRENA  . Cystoscopy N/A 01/01/2013    Procedure: CYSTOSCOPY with instillation of marcaine and pyridium;  Surgeon: Lindaann SloughMarc-Henry Nesi, MD;  [facility];  Service: Urology;  Laterality: N/A;   Family History  Problem Relation Age of Onset  . Cancer Mother     cervical  . Migraines  Mother   . Hypothyroidism Sister    History  Substance Use Topics  . Smoking status: Never Smoker   . Smokeless tobacco: Never Used  . Alcohol Use: Yes   OB History    Gravida Para Term Preterm AB TAB SAB Ectopic Multiple Living   2 1        1      Review of Systems  All other systems reviewed and are negative.   Allergies  Amoxicillin; Penicillins; and Other  Home Medications   Prior to Admission medications   Medication Sig Start Date End Date Taking? Authorizing Provider  predniSONE (DELTASONE) 10 MG tablet Take 2 tablets (20 mg total) by mouth 2 (two) times daily. 09/25/14  Yes Geoffery Lyonsouglas Delo, MD  dicyclomine (BENTYL) 20 MG tablet Take 1 tablet (20 mg total) by mouth 2 (two) times daily. Patient not taking: Reported on 08/12/2014 12/31/13   Gavin PoundJustin Brooten, MD  doxycycline (VIBRAMYCIN) 100 MG capsule Take 1 capsule (100 mg total) by mouth 2 (two) times daily. Patient not taking: Reported on 08/12/2014 12/30/13   Gavin PoundJustin Brooten, MD  HYDROcodone-acetaminophen [facility](NORCO) 5-325 MG per tablet Take 2 tablets by mouth every 4 (four) hours as needed. Patient not taking: Reported on 09/26/2014 09/25/14   Geoffery Lyonsouglas Delo, MD  ibuprofen (ADVIL,MOTRIN) 400 MG tablet Take 1 tablet (400 mg total) by mouth every 6 (six) hours as needed. Patient not taking: Reported on  09/26/2014 12/30/13   Gavin Pound, MD  metroNIDAZOLE (FLAGYL) 500 MG tablet Take 1 tablet (500 mg total) by mouth 2 (two) times daily. Patient not taking: Reported on 08/12/2014 12/31/13   Gavin Pound, MD  naproxen (NAPROSYN) 500 MG tablet Take 1 tablet (500 mg total) by mouth 2 (two) times daily. Patient not taking: Reported on 09/25/2014 08/12/14   Harle Battiest, NP  ondansetron (ZOFRAN) 4 MG tablet Take 1 tablet (4 mg total) by mouth every 6 (six) hours. Patient not taking: Reported on 09/25/2014 08/12/14   Harle Battiest, NP  oseltamivir (TAMIFLU) 75 MG capsule Take 1 capsule (75 mg total) by mouth every 12 (twelve) hours. Patient not  taking: Reported on 09/25/2014 08/12/14   Harle Battiest, NP  traMADol (ULTRAM) 50 MG tablet Take 1 tablet (50 mg total) by mouth every 6 (six) hours as needed. Patient not taking: Reported on 08/12/2014 04/15/14   Joni Reining Pisciotta, PA-C   BP 116/56 mmHg  Pulse 80  Temp(Src) 98.1 F (36.7 C) (Oral)  Resp 16  SpO2 98%  LMP 09/18/2014 Physical Exam  Constitutional: She is oriented to person, place, and time. She appears well-developed and well-nourished.  HENT:  Head: Normocephalic and atraumatic.  Eyes: Pupils are equal, round, and reactive to light.  Neck: Normal range of motion. Neck supple. No JVD present. No tracheal deviation present. No thyromegaly present.  Cardiovascular: Normal rate, regular rhythm, normal heart sounds and intact distal pulses.  Exam reveals no gallop and no friction rub.   No murmur heard. Pulmonary/Chest: Effort normal and breath sounds normal. No accessory muscle usage or stridor. No respiratory distress. She has no decreased breath sounds. She has no wheezes. She has no rales. She exhibits no tenderness.  Tender to palpation of superior bilateral anterior chest wall.  Musculoskeletal: Normal range of motion.  Exquisitely tender to the left para thoracic, lumbar muscles. Nontender throughout remainder of back. No signs of trauma  Lymphadenopathy:    She has no cervical adenopathy.  Neurological: She is alert and oriented to person, place, and time. Coordination normal.  Skin: Skin is warm and dry.  Psychiatric: She has a normal mood and affect. Her behavior is normal. Judgment and thought content normal.  Nursing note and vitals reviewed.    ED Course  Procedures (including critical care time) Labs Review Labs Reviewed - No data to display  Imaging Review Dg Chest 2 View (if Patient Has Fever And/or Copd)  09/26/2014   CLINICAL DATA:  Chest pain and shortness of breath for 3 days  EXAM: CHEST  2 VIEW  COMPARISON:  09/25/2014  FINDINGS: The heart size  and mediastinal contours are within normal limits. Both lungs are clear. The visualized skeletal structures are unremarkable.  IMPRESSION: No active cardiopulmonary disease.   Electronically Signed   By: Christiana Pellant M.D.   On: 09/26/2014 16:24   Dg Chest 2 View  09/25/2014   CLINICAL DATA:  Dyspnea and posterior left-sided chest pain for 2 weeks.  EXAM: CHEST  2 VIEW  COMPARISON:  08/11/2014  FINDINGS: There is mild interstitial coarsening in the left upper lobe and left perihilar region, without confluent airspace opacity. There is no effusion. Hilar and mediastinal contours are normal and unchanged. Heart size is normal.  IMPRESSION: Mild interstitial coarsening in the left upper lobe. This could represent viral or interstitial pneumonitis.   Electronically Signed   By: Ellery Plunk M.D.   On: 09/25/2014 04:46     EKG Interpretation None  MDM   Final diagnoses:  Musculoskeletal pain    Labs: None indicated  Imaging: DG chest shows no active cardiopulmonary disease  Consults: None  Therapeutics: None  Assessment: Musculoskeletal chest wall, back pain  Plan: Patient presents with back pain and anterior chest wall pain, this pain is reproduced with palpation, she is exquisitely tender to palpation. She reports this is the pain she feels when she takes a deep breath. 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 Patient has no significant cardiovascular risk factors, is a healthy young female, her vital signs are normal she is not tachycardic, tachypneic, hypoxic, she has no lower extremity edema or swelling, and she has no risk factors for PE; perk negative. Patient has no significant cardiovascular factors, reports shortness of breath but only with deep inspiration, no heart palpitations, this is unlikely cardiac in nature. Patient has no upper respiratory symptoms, with a negative chest x-ray, unlikely  pulmonary disease. Patients presentation likely represents muscular pain from increased workload. She is discharged home with instructions to use ibuprofen, heat packs, Robaxin as needed for muscle spasms, and instructions to reduce workload, and rest. Patient was given strict return precautions the event that new or worsening signs or symptoms presented, she is encouraged to return to the emergency room if needed. Patient verbalized her understanding to today's plan, and agreed to follow-up as needed.      Eyvonne MechanicJeffrey Anish Vana, PA-C 09/27/14 0001  Tilden FossaElizabeth Rees, MD 09/27/14 [phone]

## 2014-09-26 NOTE — ED Notes (Signed)
Pt seen here Saturday and diagnosed with pleurisy and still having pain

## 2014-09-26 NOTE — Discharge Instructions (Signed)
Chest Wall Pain Chest wall pain is pain in or around the bones and muscles of your chest. It may take up to 6 weeks to get better. It may take longer if you must stay physically active in your work and activities.  CAUSES  Chest wall pain may happen on its own. However, it may be caused by:  A viral illness like the flu.  Injury.  Coughing.  Exercise.  Arthritis.  Fibromyalgia.  Shingles. HOME CARE INSTRUCTIONS   Avoid overtiring physical activity. Try not to strain or perform activities that cause pain. This includes any activities using your chest or your abdominal and side muscles, especially if heavy weights are used.  Put ice on the sore area.  Put ice in a plastic bag.  Place a towel between your skin and the bag.  Leave the ice on for 15-20 minutes per hour while awake for the first 2 days.  Only take over-the-counter or prescription medicines for pain, discomfort, or fever as directed by your caregiver. SEEK IMMEDIATE MEDICAL CARE IF:   Your pain increases, or you are very uncomfortable.  You have a fever.  Your chest pain becomes worse.  You have new, unexplained symptoms.  You have nausea or vomiting.  You feel sweaty or lightheaded.  You have a cough with phlegm (sputum), or you cough up blood. MAKE SURE YOU:   Understand these instructions.  Will watch your condition.  Will get help right away if you are not doing well or get worse. Document Released: 05/06/2005 Document Revised: 07/29/2011 Document Reviewed: 12/31/2010 [facility] Patient Information 2015 [employer]. This information is not intended to replace advice given to you by your health care provider. Make sure you discuss any questions you have with your health care provider.  Please use Robaxin as needed for muscular spasms. Please use heat, ibuprofen, rest until symptoms resolve. If new or worsening symptoms present please return to the emergency room for further evaluation and  management.

## 2014-10-11 ENCOUNTER — Emergency Department (HOSPITAL_COMMUNITY)
Admission: EM | Admit: 2014-10-11 | Discharge: 2014-10-12 | Disposition: A | Discharge status: Home / Self Care | Payer: Medicaid Other | Attending: Emergency Medicine | Admitting: Emergency Medicine

## 2014-10-11 ENCOUNTER — Encounter (HOSPITAL_COMMUNITY): Payer: Self-pay | Admitting: Emergency Medicine

## 2014-10-11 DIAGNOSIS — Z79899 Other long term (current) drug therapy: Secondary | ICD-10-CM | POA: Insufficient documentation

## 2014-10-11 DIAGNOSIS — Z8639 Personal history of other endocrine, nutritional and metabolic disease: Secondary | ICD-10-CM | POA: Diagnosis not present

## 2014-10-11 DIAGNOSIS — M542 Cervicalgia: Secondary | ICD-10-CM | POA: Diagnosis not present

## 2014-10-11 DIAGNOSIS — M545 Low back pain: Secondary | ICD-10-CM | POA: Diagnosis present

## 2014-10-11 DIAGNOSIS — M62830 Muscle spasm of back: Secondary | ICD-10-CM

## 2014-10-11 DIAGNOSIS — Z880 Allergy status to penicillin: Secondary | ICD-10-CM | POA: Insufficient documentation

## 2014-10-11 DIAGNOSIS — R05 Cough: Secondary | ICD-10-CM | POA: Insufficient documentation

## 2014-10-11 DIAGNOSIS — Z7952 Long term (current) use of systemic steroids: Secondary | ICD-10-CM | POA: Insufficient documentation

## 2014-10-11 MED ORDER — DIAZEPAM 5 MG/ML IJ SOLN
2.5000 mg | Freq: Once | INTRAMUSCULAR | Status: AC
  Administered 2014-10-11: 2.5 mg via INTRAMUSCULAR
  Filled 2014-10-11: qty 2

## 2014-10-11 NOTE — ED Provider Notes (Signed)
CSN: [id number]     Arrival date & time 10/11/14  2154 History  This chart was scribed for non-physician practitioner Marlon Pel, PA-C working with Mancel Bale, MD by Lyndel Safe, ED Scribe. This patient was seen in room TR06C/TR06C and the patient's care was started at 10:46 PM.   Chief Complaint  Patient presents with  . Back Pain  . Cough   The history is provided by the patient. No language interpreter was used.   HPI Comments: Lindsay Graham is a 24 y.o. female who presents to the Emergency Department complaining of constant, progressively worsening left-sided back pain that radiates to her left arm and has been going on for 1 month. She also notes associated intermittent neck pain and muscle spasms in her left arm, back and leg on the left. 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 Pt was seen in the ED on 5/08 and 5/09 for the same complaint. She was given prednisone and hydrocodone for pleurisy with some improvement but symptoms returned. She states she has also tried a heating pad, icy hot, chiropractor and massage therapist with no relief. Pt denies incontinence, lower extremity weakness, and any injury or trauma related to the pain.   Past Medical History  Diagnosis Date  . Hematuria   . PONV (postoperative nausea and vomiting)   . Frequency of urination   . Hyperthyroidism     DX 2010--  NO TX PER PT --  JUST MONITORED  . History of chronic bronchitis   . Urgency of urination   . Nocturia    Past Surgical History  Procedure Laterality Date  . Wisdom tooth extraction  X2  2014  . Intrauterine device insertion  10-25-2010    MIRENA  . Cystoscopy N/A 01/01/2013    Procedure: CYSTOSCOPY with instillation of marcaine and pyridium;  Surgeon: Lindaann Slough, MD;  Location: [facility];  Service: Urology;  Laterality: N/A;   Family History  Problem Relation Age of Onset  . Cancer Mother     cervical  . Migraines Mother   . Hypothyroidism Sister    History  Substance Use Topics  . Smoking status: Never Smoker   . Smokeless tobacco: Never Used  . Alcohol Use: Yes   OB History    Gravida Para Term Preterm AB TAB SAB Ectopic Multiple Living   Review of Systems  Respiratory: Positive for cough.   Musculoskeletal: Positive for back pain and neck pain.  Skin: Negative for rash.  Neurological: Positive for weakness and numbness.  All other systems reviewed and are negative.     Allergies  Amoxicillin; Penicillins; and Other  Home Medications   Prior to Admission medications   Medication Sig Start Date End Date Taking? Authorizing Provider  diazepam (VALIUM) 5 MG tablet Take 1 tablet (5 mg total) by mouth 2 (two) times daily. 10/12/14   Hadar Elgersma Neva Seat, PA-C  dicyclomine (BENTYL) 20 MG tablet Take 1 tablet (20 mg total) by mouth 2 (two) times daily. Patient not taking: Reported on 08/12/2014 12/31/13   Gavin Pound, MD  doxycycline (VIBRAMYCIN) 100 MG capsule Take 1 capsule (100 mg total) by mouth 2 (two) times daily. Patient not taking: Reported on 08/12/2014 12/30/13   Gavin Pound, MD  HYDROcodone-acetaminophen (NORCO) 5-325 MG per tablet Take 2 tablets by mouth every 4 (four) hours as needed. Patient not taking: Reported on 09/26/2014 09/25/14   Geoffery Lyons, MD  ibuprofen (ADVIL,MOTRIN) 400 MG tablet Take 1 tablet (400 mg total) by mouth every 6 (six) hours as needed. Patient not taking: Reported on 09/26/2014 12/30/13   Gavin Pound, MD  lidocaine (LIDODERM) 5 % Place 1 patch onto the skin daily. Remove & Discard patch within 12 hours or as directed by MD 10/12/14   Marlon Pel, PA-C  lidocaine (LIDODERM) 5 % Place 1 patch onto the skin daily. Remove & Discard patch within 12 hours or as directed by MD 10/12/14   Marlon Pel,  PA-C  methocarbamol (ROBAXIN) 500 MG tablet Take 1 tablet (500 mg total) by mouth 2 (two) times daily. 09/26/14   Eyvonne Mechanic, PA-C  metroNIDAZOLE (FLAGYL) 500 MG tablet Take 1 tablet (500 mg total) by mouth 2 (two) times daily. Patient not taking: Reported on 08/12/2014 12/31/13   Gavin Pound, MD  naproxen (NAPROSYN) 500 MG tablet Take 1 tablet (500 mg total) by mouth 2 (two) times daily. Patient not taking: Reported on 09/25/2014 08/12/14   Harle Battiest, NP  ondansetron (ZOFRAN) 4 MG tablet Take 1 tablet (4 mg total) by mouth every 6 (six) hours. Patient not taking: Reported on 09/25/2014 08/12/14   Harle Battiest, NP  oseltamivir (TAMIFLU) 75 MG capsule Take 1 capsule (75 mg total) by mouth every 12 (twelve) hours. Patient not taking: Reported on 09/25/2014 08/12/14   Harle Battiest, NP  predniSONE (DELTASONE) 10 MG tablet Take 2 tablets (20 mg total) by mouth 2 (two) times daily. 09/25/14   Geoffery Lyons, MD  traMADol (ULTRAM) 50 MG tablet Take 1 tablet (50 mg total) by mouth every 6 (six) hours as needed. Patient not taking: Reported on 08/12/2014 04/15/14   Joni Reining Pisciotta, PA-C   BP 109/62 mmHg  Pulse 68  Temp(Src) 98.6 F (37 C) (Oral)  Resp 16  Ht  (1.473 m)  Wt 118 lb 6.4 oz (53.706 kg)  BMI 24.75 kg/m2  SpO2 98%  LMP 10/09/2014 Physical Exam  Constitutional: She appears well-developed and well-nourished.  HENT:  Head: Normocephalic and atraumatic.  Eyes: Conjunctivae are normal. Right eye exhibits no discharge. Left eye exhibits no discharge.  Neck: No spinous process tenderness and no muscular tenderness present.  FROM of neck muscles, tenderness to midline of cervical spine  Pulmonary/Chest: Effort normal. No respiratory distress.  Musculoskeletal:  Pt has equal strength to bilateral lower extremities.  Neurosensory function adequate to both legs No clonus on dorsiflextion Skin color is normal. Skin is warm and moist.  I see no step off deformity, no  midline bony tenderness.  Pt is able to ambulate.  No crepitus, laceration, effusion, induration, lesions, swelling.   Pedal pulses are symmetrical and palpable bilaterally   tenderness to palpation of paraspinel muscles of the cervical. Thoracic and lumbar spine.   Neurological: She is alert. Coordination normal.  Skin: Skin is warm and dry. No rash noted. She is not diaphoretic. No erythema.  Psychiatric: She has a normal mood and affect.  Nursing note and vitals reviewed.   ED Course  Procedures  DIAGNOSTIC STUDIES: Oxygen Saturation is 98% on RA, normal by my interpretation.    COORDINATION OF CARE: 10:52 PM Discussed treatment plan which includes to order a CT cervical spine w/o contrast with pt. Pt acknowledges and agrees to plan.   Labs Review Labs Reviewed - No  data to display  Imaging Review Ct Cervical Spine Wo Contrast  10/12/2014   CLINICAL DATA:  LEFT neck pain radiating to LEFT leg for 1 month.  EXAM: CT CERVICAL SPINE WITHOUT CONTRAST  TECHNIQUE: Multidetector CT imaging of the cervical spine was performed without intravenous contrast. Multiplanar CT image reconstructions were also generated.  COMPARISON:  CT angiogram of the neck July 22, 2014  FINDINGS: Cervical vertebral bodies and posterior elements are intact and aligned with broad reversed cervical lordosis. Intervertebral disc heights preserved. No destructive bony lesions. C1-2 articulation maintained. Included prevertebral and paraspinal soft tissues are unremarkable.  IMPRESSION: Broad reversed cervical lordosis without acute fracture, malalignment nor advanced degenerative change.   Electronically Signed   By: Awilda Metroourtnay  Bloomer M.D.   On: 10/12/2014 00:46     EKG Interpretation None      MDM   Final diagnoses:  Neck pain  Back muscle spasm    This is patient's third visit to the emergency department, therefore CT scan of the neck is done to further evaluate times. CT scan shows reversal of normal  cervical lordosis which is consistent with patient's complaint of muscle spasm. Vertebra remain intact. Disc heights are preserved. i am unable to appreciate the patient's weakness on physical exam.  Discussed the need for rest with the patient, but she continues to do 6 nights a week up to 8 hours a night. She describes her job is very physically demanding and is unable to take off any more time. She has been made aware that this will aggravate her symptoms and if she does get better it will take considerably more time or she may continue to get worse. She has given a referral to orthopedic physician for follow-up.  Rx: muscle relaxers and lidoderm patch  23 y.o.Lindsay Graham's evaluation in the Emergency Department is complete. It has been determined that no acute conditions requiring further emergency intervention are present at this time. The patient/guardian have been advised of the diagnosis and plan. We have discussed signs and symptoms that warrant return to the ED, such as changes or worsening in symptoms.  Vital signs are stable at discharge. Filed Vitals:   10/11/14 2209  BP: 109/62  Pulse: 68  Temp: 98.6 F (37 C)  Resp: 16    Patient/guardian has voiced understanding and agreed to follow-up with the PCP or specialist.    personally performed the services described in this documentation, which was scribed in my presence. The recorded information has been reviewed and is accurate.    Marlon Peliffany Nykolas Bacallao, PA-C 10/12/14 0105  Mancel BaleElliott Wentz, MD 10/12/14 [phone]

## 2014-10-11 NOTE — ED Notes (Signed)
Pt. reports left shoulder/left back pain for 1 month denies injury , pt. added occasional dry cough , no fever or chills.

## 2014-10-12 ENCOUNTER — Emergency Department (HOSPITAL_COMMUNITY): Payer: Medicaid Other

## 2014-10-12 ENCOUNTER — Encounter (HOSPITAL_COMMUNITY): Payer: Self-pay

## 2014-10-12 MED ORDER — DIAZEPAM 5 MG PO TABS
5.0000 mg | ORAL_TABLET | Freq: Two times a day (BID) | ORAL | Status: AC
Start: 2014-10-12 — End: ?

## 2014-10-12 MED ORDER — LIDOCAINE 5 % EX PTCH: 1.0000 | MEDICATED_PATCH | CUTANEOUS | Status: AC

## 2014-10-12 NOTE — Discharge Instructions (Signed)

## 2014-10-12 NOTE — ED Notes (Signed)
Pt. Left with all belongings and refused wheelchair 

## 2016-01-24 IMAGING — CT CT CERVICAL SPINE W/O CM
3 of 5 series · 10 of 33 positions shown, 12 images · non-contrast
Comparison: CT angiogram of the neck July 22, 2014

CLINICAL DATA: LEFT neck pain radiating to LEFT leg for 1 month.

EXAM:
CT CERVICAL SPINE WITHOUT CONTRAST
TECHNIQUE: Multidetector CT imaging of the cervical spine was performed without
intravenous contrast. Multiplanar CT image reconstructions were also
generated.

[Series 204: orthogonals · axial · 0.39mm/px · z∈[+89,+143]mm · 2 of 88 slices shown, 3 images]
[im 30/88  soft-tissue]
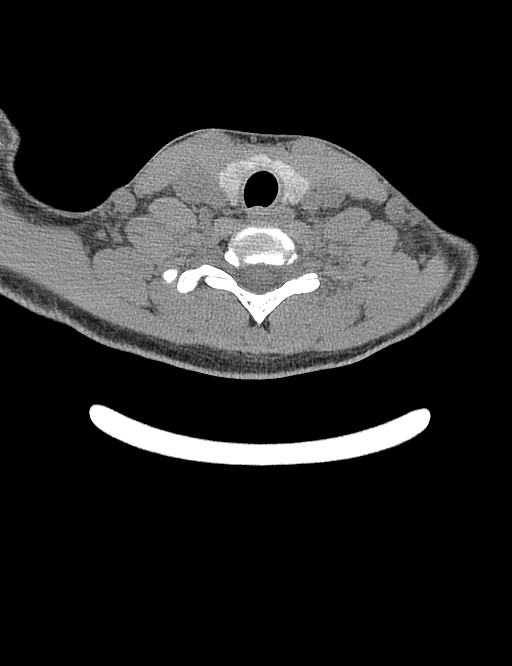
[im 30/88  bone]
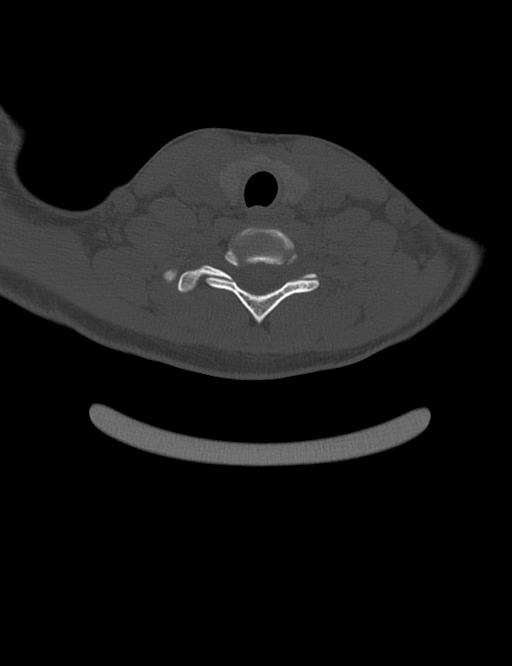
[im 59/88  bone]
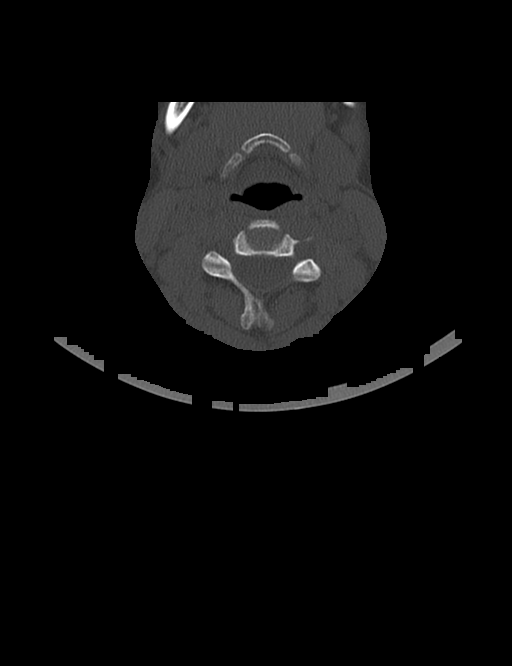

[Series 205: coronals · coronal · 0.39mm/px · 3 of 35 slices shown]
[im 7/35  bone]
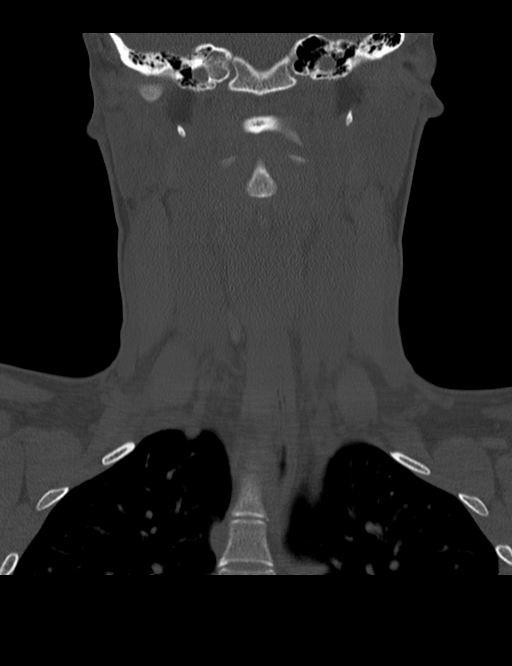
[im 14/35  bone]
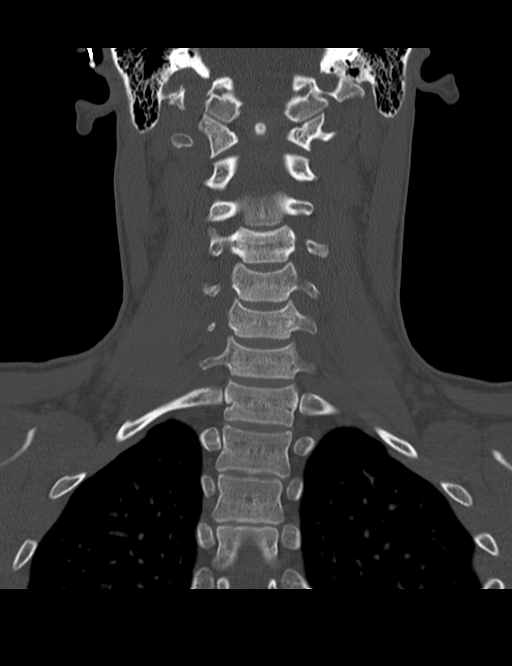
[im 21/35  bone]
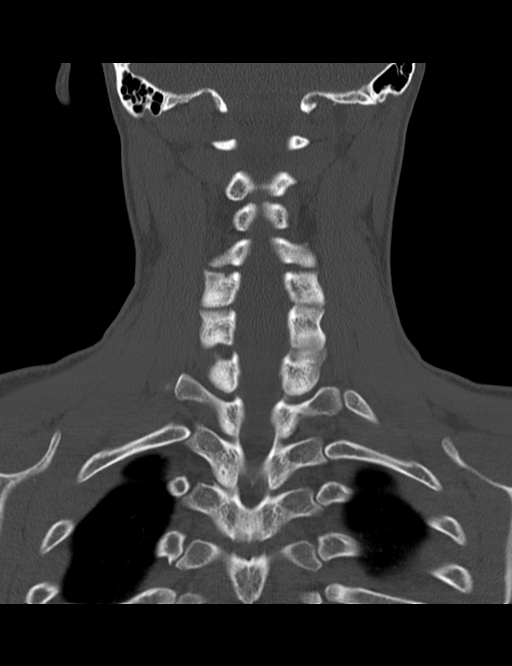

[Series 206: sagittals · sagittal · 0.39mm/px · 5 of 39 slices shown, 6 images]
[im 13/39  bone]
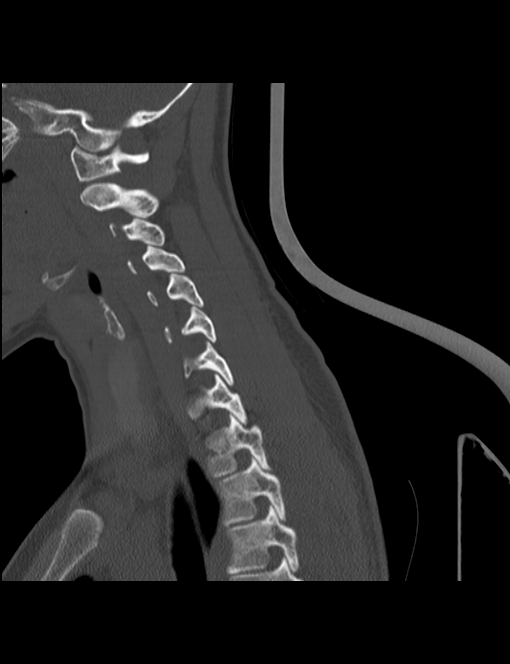
[im 16/39  bone]
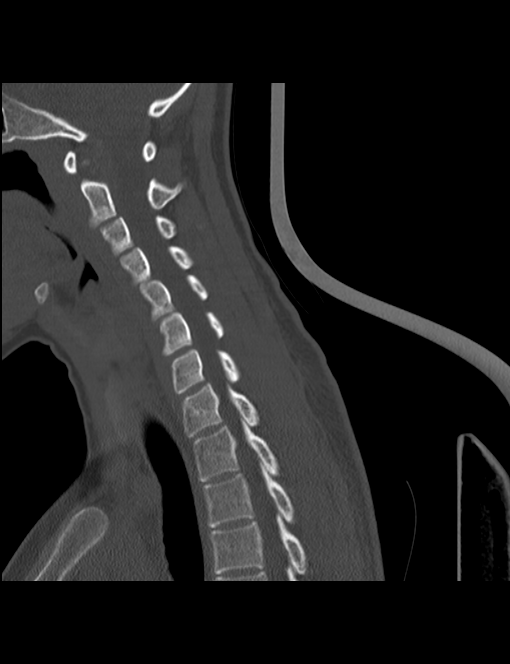
[im 20/39  soft-tissue]
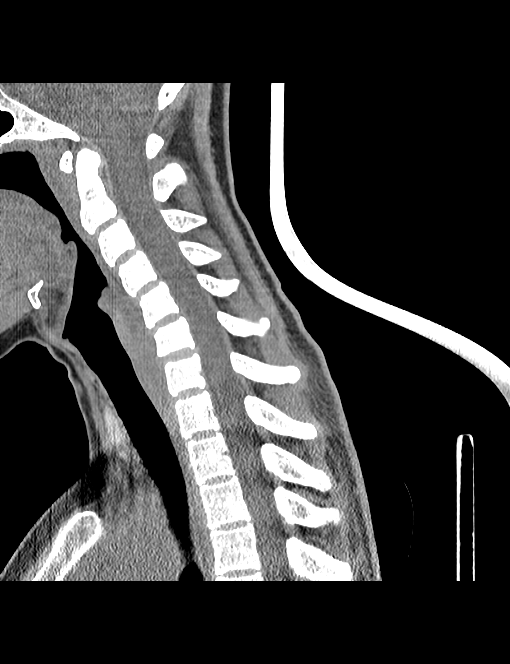
[im 20/39  bone]
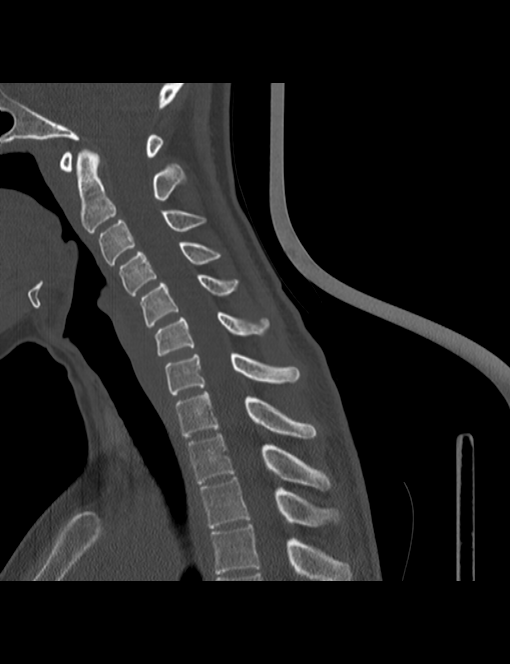
[im 23/39  bone]
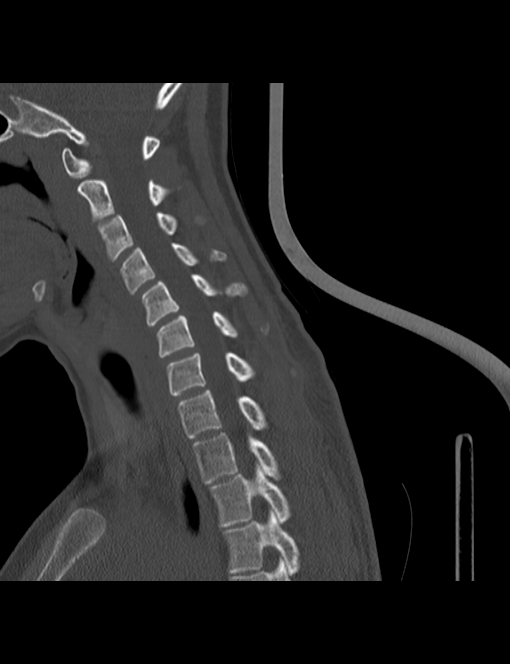
[im 26/39  bone]
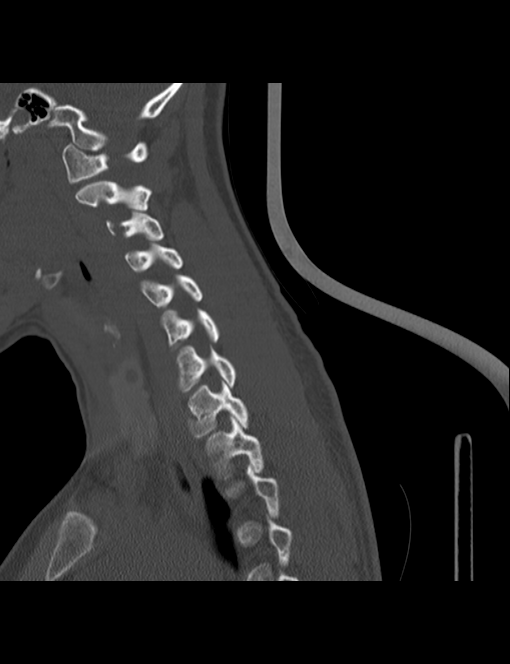

[10 of 33 positions shown; findings below may reference images not displayed]

FINDINGS: Cervical vertebral bodies and posterior elements are intact and
aligned with broad reversed cervical lordosis. Intervertebral disc
heights preserved. No destructive bony lesions. C1-2 articulation
maintained. Included prevertebral and paraspinal soft tissues are
unremarkable.
IMPRESSION: Broad reversed cervical lordosis without acute fracture,
malalignment nor advanced degenerative change.
